# Patient Record
Sex: Female | Born: 1995
Health system: Southern US, Community
[De-identification: ages and names within clinical notes are randomized; demographics above are authoritative.]

## PROBLEM LIST (undated history)

## (undated) DIAGNOSIS — E559 Vitamin D deficiency, unspecified: Secondary | ICD-10-CM

## (undated) DIAGNOSIS — D649 Anemia, unspecified: Secondary | ICD-10-CM

## (undated) DIAGNOSIS — R002 Palpitations: Secondary | ICD-10-CM

## (undated) DIAGNOSIS — J45909 Unspecified asthma, uncomplicated: Secondary | ICD-10-CM

## (undated) HISTORY — DX: Anemia, unspecified: D64.9

## (undated) HISTORY — DX: Vitamin D deficiency, unspecified: E55.9

## (undated) HISTORY — DX: Palpitations: R00.2

## (undated) HISTORY — DX: Unspecified asthma, uncomplicated: J45.909

---
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
## 2015-02-08 ENCOUNTER — Ambulatory Visit: Payer: 59 | Admitting: Family Medicine

## 2015-02-09 ENCOUNTER — Encounter: Payer: Self-pay | Admitting: Family Medicine

## 2015-02-09 ENCOUNTER — Ambulatory Visit (INDEPENDENT_AMBULATORY_CARE_PROVIDER_SITE_OTHER): Payer: 59 | Admitting: Family Medicine

## 2015-02-09 VITALS — BP 152/99 | HR 87 | Wt 170.0 lb

## 2015-02-09 DIAGNOSIS — R002 Palpitations: Secondary | ICD-10-CM | POA: Diagnosis not present

## 2015-02-09 DIAGNOSIS — T671XXD Heat syncope, subsequent encounter: Secondary | ICD-10-CM

## 2015-02-09 MED ORDER — METOPROLOL SUCCINATE ER 25 MG PO TB24: 25.0000 mg | ORAL_TABLET | Freq: Every day | ORAL | Status: DC

## 2015-02-09 NOTE — Progress Notes (Signed)
CC: April Wilkerson is a 19 y.o. female is here for Follow-up   Subjective: HPI:  Late in October patient experienced a presyncopal episode where she was sitting on the bleachers in the sun start to feel fatigued, she went down initiated and started drinking some water. She began to get shortness of breath so she tried her inhaler however it did not help at all. She felt lightheadedness and weakness to the point where she could not get up on her own. EMS was called and did an EKG which was reportedly normal. Blood pressure was in the stage I hypertension range. She admits that she did not take any of her metoprolol the night before and has been having trouble with compliance. She denies any known side effects of this medication. Symptoms improved after she was able to get some water in her system. She's not had any interventions or follow-up since then. She tells me she feels like she is in her normal state of health and denies any irregular heartbeat or chest pain. Denies any new motor or sensory disturbances.    Review Of Systems Outlined In HPI  Past Medical History  Diagnosis Date  . Asthma, chronic 10/13/2013    No past surgical history on file. No family history on file.  Social History   Social History  . Marital Status: Single    Spouse Name: N/A  . Number of Children: N/A  . Years of Education: N/A   Occupational History  . Not on file.   Social History Main Topics  . Smoking status: Never Smoker   . Smokeless tobacco: Not on file  . Alcohol Use: No  . Drug Use: No  . Sexual Activity: Not on file   Other Topics Concern  . Not on file   Social History Narrative     Objective: BP 152/99 mmHg  Pulse 87  Wt 170 lb (77.111 kg)  General: Alert and Oriented, No Acute Distress HEENT: Pupils equal, round, reactive to light. Conjunctivae clear. Moist mucous membranes pharynx unremarkable  Lungs: Clear to auscultation bilaterally, no wheezing/ronchi/rales.   Comfortable work of breathing. Good air movement. Cardiac: Regular rate and rhythm. Normal S1/S2.  No murmurs, rubs, nor gallops.   Extremities: No peripheral edema.  Strong peripheral pulses.  Mental Status: No depression, anxiety, nor agitation. Skin: Warm and dry.  Assessment & Plan: April Wilkerson was seen today for follow-up.  Diagnoses and all orders for this visit:  Palpitations -     metoprolol succinate (TOPROL-XL) 25 MG 24 hr tablet; Take 1 tablet (25 mg total) by mouth daily. -     Echocardiogram; Future  Heat syncope, subsequent encounter -     CBC -     COMPLETE METABOLIC PANEL WITH GFR -     Echocardiogram; Future   Discussed with patient most likely explanation of her episode was due to dehydration/heat syncope. Will use this as a working diagnosis however would like to rule out structural heart abnormality along with metabolic abnormality or anemia. FOLLOW-UP WILL BE BASED ON THE ABOVE RESULTS   Return if symptoms worsen or fail to improve.

## 2015-02-10 LAB — COMPLETE METABOLIC PANEL WITH GFR
ALBUMIN: 4.2 g/dL (ref 3.6–5.1)
ALT: 14 U/L (ref 5–32)
AST: 20 U/L (ref 12–32)
Alkaline Phosphatase: 72 U/L (ref 47–176)
BUN: 10 mg/dL (ref 7–20)
CALCIUM: 9.5 mg/dL (ref 8.9–10.4)
CHLORIDE: 103 mmol/L (ref 98–110)
CO2: 25 mmol/L (ref 20–31)
CREATININE: 0.62 mg/dL (ref 0.50–1.00)
GFR, Est African American: >89 mL/min (ref >=60)
GFR, Est Non African American: >89 mL/min (ref >=60)
GLUCOSE: 73 mg/dL (ref 65–99)
POTASSIUM: 4.1 mmol/L (ref 3.8–5.1)
SODIUM: 141 mmol/L (ref 135–146)
Total Bilirubin: 0.6 mg/dL (ref 0.2–1.1)
Total Protein: 6.9 g/dL (ref 6.3–8.2)

## 2015-02-10 LAB — CBC
HCT: 36.3 % (ref 36.0–46.0)
HEMOGLOBIN: 11.7 g/dL — AB (ref 12.0–15.0)
MCH: 28 pg (ref 26.0–34.0)
MCHC: 32.2 g/dL (ref 30.0–36.0)
MCV: 86.8 fL (ref 78.0–100.0)
MPV: 10.6 fL (ref 8.6–12.4)
Platelets: 276 10*3/uL (ref 150–400)
RBC: 4.18 MIL/uL (ref 3.87–5.11)
RDW: 14.5 % (ref 11.5–15.5)
WBC: 4.3 10*3/uL (ref 4.0–10.5)

## 2015-02-16 ENCOUNTER — Ambulatory Visit (HOSPITAL_BASED_OUTPATIENT_CLINIC_OR_DEPARTMENT_OTHER): Admission: RE | Admit: 2015-02-16 | Payer: 59 | Source: Ambulatory Visit

## 2015-03-16 ENCOUNTER — Ambulatory Visit (HOSPITAL_BASED_OUTPATIENT_CLINIC_OR_DEPARTMENT_OTHER)
Admission: RE | Admit: 2015-03-16 | Discharge: 2015-03-16 | Disposition: A | Discharge status: Home / Self Care | Payer: 59 | Source: Ambulatory Visit | Attending: Family Medicine | Admitting: Family Medicine

## 2015-03-16 DIAGNOSIS — R002 Palpitations: Secondary | ICD-10-CM | POA: Diagnosis not present

## 2015-03-16 DIAGNOSIS — T671XXD Heat syncope, subsequent encounter: Secondary | ICD-10-CM

## 2015-03-16 DIAGNOSIS — X58XXXD Exposure to other specified factors, subsequent encounter: Secondary | ICD-10-CM | POA: Insufficient documentation

## 2015-03-16 DIAGNOSIS — R55 Syncope and collapse: Secondary | ICD-10-CM | POA: Diagnosis present

## 2015-03-16 NOTE — Progress Notes (Signed)
Echocardiogram 2D Echocardiogram has been performed.  Dorothey BasemanReel, Jakylah Bassinger M 03/16/2015, 1:45 PM

## 2015-06-30 ENCOUNTER — Encounter: Payer: Self-pay | Admitting: Family Medicine

## 2015-06-30 ENCOUNTER — Ambulatory Visit (INDEPENDENT_AMBULATORY_CARE_PROVIDER_SITE_OTHER): Payer: 59 | Admitting: Family Medicine

## 2015-06-30 VITALS — BP 148/93 | HR 84 | Wt 176.0 lb

## 2015-06-30 DIAGNOSIS — Z0000 Encounter for general adult medical examination without abnormal findings: Secondary | ICD-10-CM

## 2015-06-30 DIAGNOSIS — Z23 Encounter for immunization: Secondary | ICD-10-CM | POA: Diagnosis not present

## 2015-06-30 DIAGNOSIS — Z111 Encounter for screening for respiratory tuberculosis: Secondary | ICD-10-CM

## 2015-06-30 LAB — COMPLETE METABOLIC PANEL WITH GFR
ALBUMIN: 4.2 g/dL (ref 3.6–5.1)
ALK PHOS: 69 U/L (ref 47–176)
ALT: 10 U/L (ref 5–32)
AST: 16 U/L (ref 12–32)
BILIRUBIN TOTAL: 0.4 mg/dL (ref 0.2–1.1)
BUN: 13 mg/dL (ref 7–20)
CALCIUM: 9.6 mg/dL (ref 8.9–10.4)
CO2: 27 mmol/L (ref 20–31)
Chloride: 104 mmol/L (ref 98–110)
Creat: 0.69 mg/dL (ref 0.50–1.00)
GFR, Est African American: >89 mL/min (ref >=60)
GLUCOSE: 83 mg/dL (ref 65–99)
Potassium: 4.3 mmol/L (ref 3.8–5.1)
SODIUM: 141 mmol/L (ref 135–146)
TOTAL PROTEIN: 7.1 g/dL (ref 6.3–8.2)

## 2015-06-30 LAB — CBC
HEMATOCRIT: 36.4 % (ref 35.0–45.0)
HEMOGLOBIN: 11.7 g/dL (ref 11.7–15.5)
MCH: 28.1 pg (ref 27.0–33.0)
MCHC: 32.1 g/dL (ref 32.0–36.0)
MCV: 87.3 fL (ref 80.0–100.0)
MPV: 10.2 fL (ref 7.5–12.5)
Platelets: 325 10*3/uL (ref 140–400)
RBC: 4.17 MIL/uL (ref 3.80–5.10)
RDW: 14.2 % (ref 11.0–15.0)
WBC: 4.5 10*3/uL (ref 3.8–10.8)

## 2015-06-30 LAB — LIPID PANEL
CHOLESTEROL: 153 mg/dL (ref 125–170)
HDL: 67 mg/dL (ref 36–76)
LDL CALC: 77 mg/dL (ref <110)
TRIGLYCERIDES: 43 mg/dL (ref 40–136)
Total CHOL/HDL Ratio: 2.3 Ratio (ref <=5.0)
VLDL: 9 mg/dL (ref <30)

## 2015-06-30 NOTE — Progress Notes (Signed)
CC: April Wilkerson is a 20 y.o. 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  Review of Systems - General ROS: negative for - chills, fever, night sweats, weight gain or weight loss Ophthalmic ROS: negative for - decreased vision Psychological ROS: negative for - anxiety or depression ENT ROS: negative for - hearing change, nasal congestion, tinnitus or allergies Hematological and Lymphatic ROS: negative for - bleeding problems, bruising or swollen lymph nodes Breast ROS: negative Respiratory ROS: no cough, shortness of breath, or wheezing Cardiovascular ROS: no chest pain or dyspnea on exertion Gastrointestinal ROS: no abdominal pain, change in bowel habits, or black or bloody stools Genito-Urinary ROS: negative for - genital discharge, genital ulcers, incontinence or abnormal bleeding from genitals Musculoskeletal ROS: negative for - joint pain or muscle pain Neurological ROS: negative for - headaches or memory loss Dermatological ROS: negative for lumps, mole changes, rash and skin lesion changes  Past Medical History  Diagnosis Date  . Asthma, chronic 10/13/2013    No past surgical history on file. No family history on file.  Social History   Social History  . Marital Status: Single    Spouse Name: N/A  . Number of Children: N/A  . Years of Education: N/A   Occupational History  . Not on file.   Social History Main Topics  . Smoking status: Never Smoker   . Smokeless tobacco:  Not on file  . Alcohol Use: No  . Drug Use: No  . Sexual Activity: Not on file   Other Topics Concern  . Not on file   Social History Narrative     Objective: BP 148/93 mmHg  Pulse 84  Wt 176 lb (79.833 kg)  General: No Acute Distress HEENT: Atraumatic, normocephalic, conjunctivae normal without scleral icterus.  No nasal discharge, hearing grossly intact, TMs with good landmarks bilaterally with no middle ear abnormalities, posterior pharynx clear without oral lesions. Neck: Supple, trachea midline, no cervical nor supraclavicular adenopathy. Pulmonary: Clear to auscultation bilaterally without wheezing, rhonchi, nor rales. Cardiac: Regular rate and rhythm.  No murmurs, rubs, nor gallops. No peripheral edema.  2+ peripheral pulses bilaterally. Abdomen: Bowel sounds normal.  No masses.  Non-tender without rebound.  Negative Murphy's sign. MSK: Grossly intact, no signs of weakness.  Full strength throughout upper and lower extremities.  Full ROM in upper and lower extremities.  No midline spinal tenderness. Neuro: Gait unremarkable, CN II-XII grossly intact.  C5-C6 Reflex 2/4 Bilaterally, L4 Reflex 2/4 Bilaterally.  Cerebellar function intact. Skin: No rashes. Psych: Alert and oriented to person/place/time.  Thought process normal. No anxiety/depression.  Assessment & Plan: [name] was seen today for annual exam.  Diagnoses and all orders for this visit:  Annual physical exam -     Lipid panel -     CBC -     COMPLETE METABOLIC PANEL WITH GFR  Flu vaccine need -     Flu Vaccine QUAD 36+ mos PF IM (Fluarix & Fluzone Quad PF)  Encounter for PPD test   Healthy lifestyle interventions including but not  limited to regular exercise, a healthy low fat diet, moderation of salt intake, the dangers of tobacco/alcohol/recreational drug use, nutrition supplementation, and accident avoidance were discussed with the patient and a handout was provided for future reference.  We've  scheduled her to return for nurse visit on Monday to have a PPD placed and then read on Wednesday. She also had one that was normal back in July and she was asked to contact her program to see if this is valid, if so she does not need to follow-up on Monday.  No Follow-up on file.

## 2015-07-04 ENCOUNTER — Ambulatory Visit (INDEPENDENT_AMBULATORY_CARE_PROVIDER_SITE_OTHER): Payer: 59 | Admitting: Family Medicine

## 2015-07-04 VITALS — BP 140/87 | HR 81

## 2015-07-04 DIAGNOSIS — Z111 Encounter for screening for respiratory tuberculosis: Secondary | ICD-10-CM | POA: Diagnosis not present

## 2015-07-04 DIAGNOSIS — Z23 Encounter for immunization: Secondary | ICD-10-CM

## 2015-07-04 NOTE — Progress Notes (Signed)
Patient came into clinic today for PPD placement. Pt has had a PPD test in the past, never tested positive. Pt tolerated placement well in left arm, no immediate complications. Pt advised to return Wednesday for PPD read and to get her paperwork. No further questions.

## 2015-07-04 NOTE — Progress Notes (Signed)
Pt.notified

## 2015-07-06 ENCOUNTER — Ambulatory Visit (INDEPENDENT_AMBULATORY_CARE_PROVIDER_SITE_OTHER): Payer: 59 | Admitting: Family Medicine

## 2015-07-06 VITALS — BP 122/75 | HR 88

## 2015-07-06 DIAGNOSIS — Z7689 Persons encountering health services in other specified circumstances: Secondary | ICD-10-CM | POA: Diagnosis not present

## 2015-07-06 DIAGNOSIS — Z111 Encounter for screening for respiratory tuberculosis: Secondary | ICD-10-CM

## 2015-07-06 LAB — TB SKIN TEST
Induration: 0 mm
TB Skin Test: NEGATIVE

## 2015-07-06 NOTE — Progress Notes (Signed)
Patient came into clinic today for PPD read. PPD was negative. Results were documented and form was completed by PCP for school. No further questions.

## 2015-07-13 ENCOUNTER — Ambulatory Visit (INDEPENDENT_AMBULATORY_CARE_PROVIDER_SITE_OTHER): Payer: 59 | Admitting: Family Medicine

## 2015-07-13 VITALS — BP 134/85 | HR 93

## 2015-07-13 DIAGNOSIS — Z111 Encounter for screening for respiratory tuberculosis: Secondary | ICD-10-CM | POA: Diagnosis not present

## 2015-07-13 NOTE — Progress Notes (Signed)
April Wilkerson is here for a PPD placement. She has never had a positive PPD.   Patient tolerated injection well without complications.

## 2015-07-15 ENCOUNTER — Ambulatory Visit (INDEPENDENT_AMBULATORY_CARE_PROVIDER_SITE_OTHER): Payer: 59 | Admitting: Family Medicine

## 2015-07-15 VITALS — BP 126/81 | HR 95

## 2015-07-15 DIAGNOSIS — Z111 Encounter for screening for respiratory tuberculosis: Secondary | ICD-10-CM | POA: Diagnosis not present

## 2015-07-15 LAB — TB SKIN TEST
INDURATION: 0 mm
TB SKIN TEST: NEGATIVE

## 2015-07-15 NOTE — Progress Notes (Signed)
April Wilkerson is here for a PPD reading.   PPD negative with 0 mm.

## 2016-04-17 DIAGNOSIS — Z7189 Other specified counseling: Secondary | ICD-10-CM | POA: Diagnosis not present

## 2016-04-17 DIAGNOSIS — Z1159 Encounter for screening for other viral diseases: Secondary | ICD-10-CM | POA: Diagnosis not present

## 2016-06-04 DIAGNOSIS — L709 Acne, unspecified: Secondary | ICD-10-CM | POA: Diagnosis not present

## 2017-02-05 DIAGNOSIS — Z23 Encounter for immunization: Secondary | ICD-10-CM | POA: Diagnosis not present

## 2017-04-12 ENCOUNTER — Ambulatory Visit: Payer: 59 | Admitting: Osteopathic Medicine

## 2017-04-12 ENCOUNTER — Encounter: Payer: Self-pay | Admitting: Osteopathic Medicine

## 2017-04-12 VITALS — BP 143/85 | HR 96 | Temp 98.2°F | Ht 64.0 in | Wt 185.1 lb

## 2017-04-12 DIAGNOSIS — R030 Elevated blood-pressure reading, without diagnosis of hypertension: Secondary | ICD-10-CM | POA: Diagnosis not present

## 2017-04-12 DIAGNOSIS — R51 Headache: Secondary | ICD-10-CM | POA: Diagnosis not present

## 2017-04-12 DIAGNOSIS — R7989 Other specified abnormal findings of blood chemistry: Secondary | ICD-10-CM | POA: Diagnosis not present

## 2017-04-12 DIAGNOSIS — Z0000 Encounter for general adult medical examination without abnormal findings: Secondary | ICD-10-CM | POA: Diagnosis not present

## 2017-04-12 DIAGNOSIS — R6889 Other general symptoms and signs: Secondary | ICD-10-CM | POA: Diagnosis not present

## 2017-04-12 DIAGNOSIS — Z803 Family history of malignant neoplasm of breast: Secondary | ICD-10-CM | POA: Diagnosis not present

## 2017-04-12 DIAGNOSIS — J4520 Mild intermittent asthma, uncomplicated: Secondary | ICD-10-CM | POA: Diagnosis not present

## 2017-04-12 DIAGNOSIS — D649 Anemia, unspecified: Secondary | ICD-10-CM | POA: Diagnosis not present

## 2017-04-12 DIAGNOSIS — R519 Headache, unspecified: Secondary | ICD-10-CM

## 2017-04-12 MED ORDER — ALBUTEROL SULFATE HFA 108 (90 BASE) MCG/ACT IN AERS: 1.0000 | INHALATION_SPRAY | RESPIRATORY_TRACT | 99 refills | Status: DC | PRN

## 2017-04-12 NOTE — Progress Notes (Signed)
HPI: April Wilkerson is a 22 y.o. female who  has a past medical history of Asthma, chronic (10/13/2013).  she presents to [facility] today, 04/12/17,  for chief complaint of: Establish care  (+)Depression screening - stress at school, she has spoken to a counselor before about depression/anxiety and this helped. No thoughts of hurting self/others.   (+)cold hands/feet, would like [place] to look into this   (+)FH Breast CA - not sure age of mother at dx but was young (previous notes mention age 49). No abn breast lumps.   (+)Hx asthma, doesn't really bother her but ok to have inhaler on hand as needed.   See below for review of preventive care   At end of visit she mentions headaches on and off for a month or so, no stabbing pain, no focal weakness or speech/vision change, no LOC.   Not fasting today     Past medical, surgical, social and family history reviewed:  Patient Active Problem List   Diagnosis Date Noted  . Constipation 10/28/2014  . Anemia 10/15/2014  . Hypokalemia 10/15/2014  . Family history of breast cancer 10/14/2014  . Acne 02/15/2014  . Asthma, chronic 10/13/2013  . Faintness 10/13/2013   History reviewed. No pertinent surgical history.  Social History   Tobacco Use  . Smoking status: Never Smoker  Substance Use Topics  . Alcohol use: No   No family history on file.     Current medication list and allergy/intolerance information reviewed:  No meds    No Known Allergies    Review of Systems:  Constitutional:  No  fever, no chills, No recent illness, No unintentional weight changes. No significant fatigue.   HEENT: +headache, no vision change, no hearing change, No sore throat, No  sinus pressure  Cardiac: No  chest pain, No  pressure, No palpitations, No  Orthopnea  Respiratory:  No  shortness of breath. No  Cough  Gastrointestinal: No  abdominal pain, No  nausea, No  vomiting,  No  blood in stool,  No  diarrhea, No  constipation   Musculoskeletal: No new myalgia/arthralgia  Skin: No  Rash, No other wounds/concerning lesions  Genitourinary: No  incontinence, No  abnormal genital bleeding, No abnormal genital discharge  Hem/Onc: No  easy bruising/bleeding, No  abnormal lymph node  Endocrine: +cold intolerance,  No heat intolerance. No polyuria/polydipsia/polyphagia   Neurologic: No  weakness, No  dizziness, No  slurred speech/focal weakness/facial droop  Psychiatric: No  concerns with depression, +concerns with anxiety, No sleep problems, No mood problems  Exam:  BP (!) 149/88   Pulse 99   Temp 98.2 F (36.8 C) (Oral)   Ht '5\' 4"'$  (1.626 m)   Wt 185 lb 1.9 oz (84 kg)   LMP 03/27/2017   BMI 31.78 kg/m   Constitutional: VS see above. General Appearance: alert, well-developed, well-nourished, NAD  Eyes: Normal lids and conjunctive, non-icteric sclera  Ears, Nose, Mouth, Throat: MMM, Normal external inspection ears/nares/mouth/lips/gums. TM normal bilaterally. Pharynx/tonsils no erythema, no exudate. Nasal mucosa normal.   Neck: No masses, trachea midline. No thyroid enlargement. No tenderness/mass appreciated. No lymphadenopathy  Respiratory: Normal respiratory effort. no wheeze, no rhonchi, no rales  Cardiovascular: S1/S2 normal, no murmur, no rub/gallop auscultated. RRR. No lower extremity edema.   Gastrointestinal: Nontender, no masses. No hepatomegaly, no splenomegaly. No hernia appreciated. Bowel sounds normal. Rectal exam deferred.   Musculoskeletal: Gait normal. No clubbing/cyanosis of digits.   Neurological: Normal balance/coordination. No  tremor. No cranial nerve deficit on limited exam. Motor and sensation intact and symmetric. Cerebellar reflexes intact. PERRL, EOMI  Skin: warm, dry, intact. No rash/ulcer.     Psychiatric: Normal judgment/insight. Normal mood and affect. Oriented x3.     ASSESSMENT/PLAN:   Annual physical exam - Plan: CBC with  Differential/Platelet, COMPLETE METABOLIC PANEL WITH GFR, Lipid panel, TSH, VITAMIN D 25 Hydroxy (Vit-D Deficiency, Fractures)  Cold intolerance  Nonintractable episodic headache, unspecified headache type  Family history of breast cancer  Mild intermittent asthma, unspecified whether complicated  Elevated blood pressure reading - f/u nurse visit recheck 2 weeks       Patient Instructions  Keep headache diary to track the pattern of the headaches and come see me in a month or so to talk more about this, sooner if severe symptoms! Can try Excedrin Migraine medication (not sure whether these are migraines but the medication has Aspirin-Acetaminophen-Caffeine which should help)  Pap at your convenience  Labs today or can come back fasting    FEMALE PREVENTIVE CARE Updated 04/12/17   ANNUAL SCREENING/COUNSELING  Diet/Exercise - HEALTHY HABITS DISCUSSED TO DECREASE CV RISK Social History   Tobacco Use  Smoking Status Never Smoker  Smokeless Tobacco Never Used   Social History   Substance and Sexual Activity  Alcohol Use No   Depression screen PHQ 2/9 04/12/2017  Decreased Interest 1  Down, Depressed, Hopeless 1  PHQ - 2 Score 2  Altered sleeping 2  Tired, decreased energy 2  Change in appetite 1  Feeling bad or failure about yourself  2  Trouble concentrating 1  Moving slowly or fidgety/restless 0  Suicidal thoughts 0  PHQ-9 Score 10  Difficult doing work/chores Somewhat difficult    GAD 7 : Generalized Anxiety Score 04/12/2017  Nervous, Anxious, on Edge 2  Control/stop worrying 2  Worry too much - different things 2  Trouble relaxing 2  Restless 0  Easily annoyed or irritable 1  Afraid - awful might happen 0  Total GAD 7 Score 9      Domestic violence concerns - no  HTN SCREENING - SEE [place]  Sexually active in the past year - No  Need/want STI testing today? - no  Concerns about libido or pain with sex? - no  Plans for  pregnancy? - none soon  INFECTIOUS DISEASE SCREENING  HIV - does not need  GC/CT - does not need  TB - does not need  DISEASE SCREENING  Lipid - needs  DM2 - needs  Osteoporosis - women age 53+ - does not need  CANCER SCREENING  Cervical - needs  Breast - needs at age 36  Lung - does not need  Colon - does not need  ADULT VACCINATION  Influenza - annual vaccine recommended  Td - booster every 10 years   Zoster - Shingrix recommended 50+  PCV13 - was not indicated  PPSV23 - was not indicated Immunization History  Administered Date(s) Administered  . DTaP 10/02/1995, 12/10/1995, 02/04/1996, 02/23/1997  . HPV Quadrivalent 04/03/2010, 11/06/2011, 07/15/2012  . Hepatitis A 12/01/2007, 04/03/2010  . Hepatitis B 06-Apr-1995, 10/02/1995, 06/12/1996  . HiB (PRP-OMP) 10/02/1995, 12/10/1995, 02/04/1996, 02/23/1997  . IPV 10/02/1995, 12/10/1995, 09/14/1996, 01/13/2013  . Influenza,inj,Quad PF,6+ Mos 06/30/2015  . Influenza-Unspecified 03/02/2000, 01/22/2008, 01/23/2008, 04/09/2011, 01/13/2013, 01/17/2017  . MMR 09/14/1996, 01/13/2013  . Meningococcal Conjugate 12/01/2007  . Meningococcal Polysaccharide 11/06/2011  . OPV 09/14/1996  . PPD Test 10/12/2014, 07/04/2015, 07/13/2015  . Tdap 10/24/2006  .  Varicella 02/23/1997, 12/01/2007    OTHER  Fall - exercise and Vit D age 8+ - does not need   Patient Instructions  Keep headache diary to track the pattern of the headaches and come see me in a month or so to talk more about this, sooner if severe symptoms! Can try Excedrin Migraine medication (not sure whether these are migraines but the medication has Aspirin-Acetaminophen-Caffeine which should help)  Pap at your convenience  Labs today or can come back fasting     Visit summary with medication list and pertinent instructions was printed for patient to review. All questions at time ofvisit were answered - patient instructed to contact office with any  additional concerns. ER/RTC precautions were reviewed with the patient.   Follow-up plan: Return in about 1 year (around 04/12/2018) for [facility], sooner for Pap at your convenience .  Note: Total time spent on problem-based portion of visit was 25 minutes, greater than 50% of the visit was spent face-to-face counseling and coordinating care for the following: Diagnoses of Cold intolerance, Nonintractable episodic headache, unspecified headache type, and Family history of breast cancer, and Asthma and Elevated BP were pertinent to this visit.[name] Kitchen  Please note: voice recognition software was used to produce this document, and typos may escape review. Please contact Dr. Sheppard Coil for any needed clarifications.

## 2017-04-12 NOTE — Patient Instructions (Signed)
Keep headache diary to track the pattern of the headaches and come see me in a month or so to talk more about this, sooner if severe symptoms! Can try Excedrin Migraine medication (not sure whether these are migraines but the medication has Aspirin-Acetaminophen-Caffeine which should help)  Pap at your convenience  Labs today or can come back fasting

## 2017-04-13 LAB — CBC WITH DIFFERENTIAL/PLATELET
BASOS PCT: 0.6 %
Basophils Absolute: 29 cells/uL (ref 0–200)
EOS PCT: 1.9 %
Eosinophils Absolute: 91 cells/uL (ref 15–500)
HEMATOCRIT: 36.5 % (ref 35.0–45.0)
HEMOGLOBIN: 12.2 g/dL (ref 11.7–15.5)
LYMPHS ABS: 1642 {cells}/uL (ref 850–3900)
MCH: 28.3 pg (ref 27.0–33.0)
MCHC: 33.4 g/dL (ref 32.0–36.0)
MCV: 84.7 fL (ref 80.0–100.0)
MPV: 10.6 fL (ref 7.5–12.5)
Monocytes Relative: 9.2 %
NEUTROS ABS: 2597 {cells}/uL (ref 1500–7800)
Neutrophils Relative %: 54.1 %
Platelets: 279 10*3/uL (ref 140–400)
RBC: 4.31 10*6/uL (ref 3.80–5.10)
RDW: 12.9 % (ref 11.0–15.0)
Total Lymphocyte: 34.2 %
WBC: 4.8 10*3/uL (ref 3.8–10.8)
WBCMIX: 442 {cells}/uL (ref 200–950)

## 2017-04-13 LAB — LIPID PANEL
CHOL/HDL RATIO: 2.2 (calc) (ref <5.0)
Cholesterol: 169 mg/dL (ref <200)
HDL: 76 mg/dL (ref >50)
LDL Cholesterol (Calc): 78 mg/dL (calc)
NON-HDL CHOLESTEROL (CALC): 93 mg/dL (ref <130)
Triglycerides: 71 mg/dL (ref <150)

## 2017-04-13 LAB — COMPLETE METABOLIC PANEL WITH GFR
AG RATIO: 1.3 (calc) (ref 1.0–2.5)
ALT: 12 U/L (ref 6–29)
AST: 18 U/L (ref 10–30)
Albumin: 4.2 g/dL (ref 3.6–5.1)
Alkaline phosphatase (APISO): 71 U/L (ref 33–115)
BUN: 11 mg/dL (ref 7–25)
CALCIUM: 9.8 mg/dL (ref 8.6–10.2)
CO2: 27 mmol/L (ref 20–32)
CREATININE: 0.71 mg/dL (ref 0.50–1.10)
Chloride: 104 mmol/L (ref 98–110)
GFR, EST AFRICAN AMERICAN: 141 mL/min/{1.73_m2} (ref >=60)
GFR, EST NON AFRICAN AMERICAN: 122 mL/min/{1.73_m2} (ref >=60)
Globulin: 3.3 g/dL (calc) (ref 1.9–3.7)
Glucose, Bld: 69 mg/dL (ref 65–99)
POTASSIUM: 4.2 mmol/L (ref 3.5–5.3)
Sodium: 139 mmol/L (ref 135–146)
TOTAL PROTEIN: 7.5 g/dL (ref 6.1–8.1)
Total Bilirubin: 0.2 mg/dL (ref 0.2–1.2)

## 2017-04-13 LAB — VITAMIN D 25 HYDROXY (VIT D DEFICIENCY, FRACTURES): Vit D, 25-Hydroxy: 9 ng/mL — ABNORMAL LOW (ref 30–100)

## 2017-04-13 LAB — TSH: TSH: 1.54 m[IU]/L

## 2017-04-15 ENCOUNTER — Other Ambulatory Visit: Payer: Self-pay | Admitting: Osteopathic Medicine

## 2017-04-15 NOTE — Progress Notes (Signed)
Vit D def 

## 2017-04-16 MED ORDER — VITAMIN D (ERGOCALCIFEROL) 1.25 MG (50000 UNIT) PO CAPS: 50000.0000 [IU] | ORAL_CAPSULE | ORAL | 0 refills | Status: DC

## 2017-04-25 ENCOUNTER — Ambulatory Visit (INDEPENDENT_AMBULATORY_CARE_PROVIDER_SITE_OTHER): Payer: 59 | Admitting: Osteopathic Medicine

## 2017-04-25 VITALS — BP 136/76 | HR 81

## 2017-04-25 DIAGNOSIS — R030 Elevated blood-pressure reading, without diagnosis of hypertension: Secondary | ICD-10-CM | POA: Diagnosis not present

## 2017-04-25 NOTE — Progress Notes (Signed)
Agree with nurse note  F/u 1 yr next annual

## 2017-04-25 NOTE — Progress Notes (Signed)
Pt in today for a bp recheck.  She has not been on the Metoprolol for a long time is asymptomatic.  Bp is good today at 136/76.  She asked me about some things she can do to help keep her pressure down and I advised her to engage in 30-45 min of moderate exercise activity on most days of the week and to eat a diet lower in sodium and fried foods.

## 2017-05-27 DIAGNOSIS — L700 Acne vulgaris: Secondary | ICD-10-CM | POA: Diagnosis not present

## 2017-05-27 DIAGNOSIS — L219 Seborrheic dermatitis, unspecified: Secondary | ICD-10-CM | POA: Diagnosis not present

## 2017-07-05 ENCOUNTER — Other Ambulatory Visit: Payer: Self-pay | Admitting: Osteopathic Medicine

## 2017-11-06 ENCOUNTER — Encounter: Payer: Self-pay | Admitting: Osteopathic Medicine

## 2017-11-06 ENCOUNTER — Ambulatory Visit (INDEPENDENT_AMBULATORY_CARE_PROVIDER_SITE_OTHER): Payer: 59 | Admitting: Osteopathic Medicine

## 2017-11-06 ENCOUNTER — Other Ambulatory Visit (HOSPITAL_COMMUNITY)
Admission: RE | Admit: 2017-11-06 | Discharge: 2017-11-06 | Disposition: A | Discharge status: Home / Self Care | Payer: 59 | Source: Ambulatory Visit | Attending: Family Medicine | Admitting: Family Medicine

## 2017-11-06 VITALS — BP 138/90 | HR 90 | Temp 98.1°F | Ht 64.0 in | Wt 196.0 lb

## 2017-11-06 DIAGNOSIS — Z803 Family history of malignant neoplasm of breast: Secondary | ICD-10-CM | POA: Diagnosis not present

## 2017-11-06 DIAGNOSIS — Z01419 Encounter for gynecological examination (general) (routine) without abnormal findings: Secondary | ICD-10-CM | POA: Diagnosis not present

## 2017-11-06 DIAGNOSIS — Z124 Encounter for screening for malignant neoplasm of cervix: Secondary | ICD-10-CM | POA: Diagnosis not present

## 2017-11-06 NOTE — Patient Instructions (Signed)
For breast cancer history, we can do genetic testing to evaluate your risk of breast cancer. We may also consider testing with mammogram +/- MRI starting at age 22. Let me know if you'd like to go ahead with the genetic testing.   Pap results should be back within a week!

## 2017-11-06 NOTE — Progress Notes (Signed)
HPI: April Wilkerson is a 22 y.o. female who  has a past medical history of Asthma, chronic (10/13/2013) and Palpitations.  she presents to [facility] today, 11/06/17,  for chief complaint of:  Pap smear  Annual exam 03/2017, never had Pap done. Here today for Pap.  (+)FH breast cancer, mother deceased at an early age, pt unsure exact age.    Past medical history, surgical history, and family history reviewed.  Current medication list and allergy/intolerance information reviewed.   (See remainder of HPI, ROS, Phys Exam below)    ASSESSMENT/PLAN:   Encounter for Papanicolaou smear for cervical cancer screening - Plan: Cytology - PAP  Family history of breast cancer    Patient Instructions  For breast cancer history, we can do genetic testing to evaluate your risk of breast cancer. We may also consider testing with mammogram +/- MRI starting at age 22. Let me know if you'd like to go ahead with the genetic testing.   Pap results should be back within a week!    Follow-up plan: Return for annual physical when due in 03/2018 .                   ############################################ ############################################ ############################################ ############################################    Outpatient Encounter Medications as of 11/06/2017  Medication Sig Note  . albuterol (PROVENTIL HFA;VENTOLIN HFA) 108 (90 Base) MCG/ACT inhaler Inhale 1-2 puffs into the lungs every 4 (four) hours as needed for wheezing or shortness of breath.   . minocycline (MINOCIN,DYNACIN) 100 MG capsule Take 100 mg by mouth 2 (two) times daily. 04/12/2017: For acne  . [DISCONTINUED] ketoconazole (NIZORAL) 2 % shampoo APP AA QD UTD   . [DISCONTINUED] metoprolol succinate (TOPROL-XL) 25 MG 24 hr tablet Take 1 tablet (25 mg total) by mouth daily. (Patient not taking: Reported on 04/25/2017)   . [DISCONTINUED] Vitamin  D, Ergocalciferol, (DRISDOL) 50000 units CAPS capsule Take 1 capsule (50,000 Units total) by mouth every 7 (seven) days. Take for 12 total doses(weeks) (Patient not taking: Reported on 11/06/2017)    No facility-administered encounter medications on file as of 11/06/2017.    No Known Allergies    Review of Systems:  Constitutional: No recent illness  Cardiac: No  chest pain  Respiratory:  No  shortness of breath.  Gastrointestinal: No  abdominal pain  Musculoskeletal: No new myalgia/arthralgia  Skin: No  Rash  Neurologic: No  weakness, No  Dizziness  Psychiatric: No  concerns with depression, No  concerns with anxiety  Exam:  BP 138/90   Pulse 90   Temp 98.1 F (36.7 C) (Oral)   Ht 5\' 4"  (1.626 m)   Wt 196 lb (88.9 kg)   BMI 33.64 kg/m   Constitutional: VS see above. General Appearance: alert, well-developed, well-nourished, NAD  Eyes: Normal lids and conjunctive, non-icteric sclera  Ears, Nose, Mouth, Throat: MMM, Normal external inspection ears/nares/mouth/lips/gums.  Neck: No masses, trachea midline.   Respiratory: Normal respiratory effort.   Skin: warm, dry, intact.   Psychiatric: Normal judgment/insight. Normal mood and affect. Oriented x3.  GYN: No lesions/ulcers to external genitalia, normal urethra, normal vaginal mucosa, physiologic discharge, cervix normal without lesions, uterus not enlarged or tender, adnexa no masses and nontender  BREAST: No rashes/skin changes, normal fibrous breast tissue, no masses or tenderness, normal nipple without discharge, normal axilla   Visit summary with medication list and pertinent instructions was printed for patient to review, advised to alert us if any changes needed. All  questions at time of visit were answered - patient instructed to contact office with any additional concerns. ER/RTC precautions were reviewed with the patient and understanding verbalized.   Follow-up plan: Return for annual physical when due in  03/2018 .    Please note: voice recognition software was used to produce this document, and typos may escape review. Please contact Dr. Lyn HollingsheadAlexander for any needed clarifications.

## 2017-11-07 LAB — CYTOLOGY - PAP: DIAGNOSIS: NEGATIVE

## 2017-11-07 NOTE — Progress Notes (Signed)
Since she is not a medicare patient G0101 is not appropriate. I will recommend in this case bill a level 2 est visit with one of the dx below:  Z01.411 Encounter for gynecological examination (general) (routine) with abnormal findings Z01.419 Encounter for gynecological examination (general) (routine) without abnormal findings  The component of E/M documentation should be documented to support this charge.    Thank you

## 2018-03-24 DIAGNOSIS — L219 Seborrheic dermatitis, unspecified: Secondary | ICD-10-CM | POA: Diagnosis not present

## 2018-03-24 DIAGNOSIS — L709 Acne, unspecified: Secondary | ICD-10-CM | POA: Diagnosis not present

## 2018-03-26 DIAGNOSIS — L700 Acne vulgaris: Secondary | ICD-10-CM | POA: Diagnosis not present

## 2018-05-01 ENCOUNTER — Encounter: Payer: Self-pay | Admitting: Osteopathic Medicine

## 2018-05-01 ENCOUNTER — Ambulatory Visit (INDEPENDENT_AMBULATORY_CARE_PROVIDER_SITE_OTHER): Payer: 59 | Admitting: Osteopathic Medicine

## 2018-05-01 VITALS — BP 127/79 | HR 79 | Temp 98.6°F | Wt 200.3 lb

## 2018-05-01 DIAGNOSIS — G4489 Other headache syndrome: Secondary | ICD-10-CM | POA: Diagnosis not present

## 2018-05-01 DIAGNOSIS — Z0101 Encounter for examination of eyes and vision with abnormal findings: Secondary | ICD-10-CM | POA: Diagnosis not present

## 2018-05-01 MED ORDER — ASPIRIN-ACETAMINOPHEN-CAFFEINE 250-250-65 MG PO TABS: 1.0000 | ORAL_TABLET | Freq: Four times a day (QID) | ORAL | 0 refills | Status: AC | PRN

## 2018-05-01 NOTE — Progress Notes (Signed)
HPI: April Wilkerson is a 23 y.o. female who  has a past medical history of Asthma, chronic (10/13/2013) and Palpitations.  [facility] today, 05/01/18,  for chief complaint of: Headache   . Context: no previous headache history, she is worried about migraines . Location: mostly on R, sometimes bilateral . Quality: pressure, "like sinus but not really like sinus" and no sharp pain. No sensitivity to sound, no nausea. Some light sensitivity.  . Duration: about a week  . Timing: daily, evenings, 30  mins to whole evening.  . Modifying factors: took ibuprofen last night which was helpful  . Assoc signs/symptoms: BP elevated on intake, better on recheck        Past medical, surgical, social and family history reviewed:  Patient Active Problem List   Diagnosis Date Noted  . Constipation 10/28/2014  . Anemia 10/15/2014  . Hypokalemia 10/15/2014  . Family history of breast cancer 10/14/2014  . Acne 02/15/2014  . Asthma, chronic 10/13/2013  . Faintness 10/13/2013    No past surgical history on file.  Social History   Tobacco Use  . Smoking status: Never Smoker  . Smokeless tobacco: Never Used  Substance Use Topics  . Alcohol use: No    Family History  Problem Relation Age of Onset  . Cancer Mother        breast cancer, not sure what age diagnosed   . Hypertension Father      Current medication list and allergy/intolerance information reviewed:    Current Outpatient Medications  Medication Sig Dispense Refill  . albuterol (PROVENTIL HFA;VENTOLIN HFA) 108 (90 Base) MCG/ACT inhaler Inhale 1-2 puffs into the lungs every 4 (four) hours as needed for wheezing or shortness of breath. 2 Inhaler prn  . spironolactone (ALDACTONE) 25 MG tablet Take 25 mg by mouth daily.    [name] Kitchen. aspirin-acetaminophen-caffeine (EXCEDRIN MIGRAINE) 250-250-65 MG tablet Take 1-2 tablets by mouth every 6 (six) hours as needed for headache. 30  tablet 0  . minocycline (MINOCIN,DYNACIN) 100 MG capsule Take 100 mg by mouth 2 (two) times daily.  2   No current facility-administered medications for this visit.     No Known Allergies    Review of Systems:  Constitutional:  No  fever, no chills, No recent illness, No unintentional weight changes. No significant fatigue.   HEENT: +headache as per HPI, no vision change, no hearing change, No sore throat, No  sinus pressure  Cardiac: No  chest pain, No  pressure  Respiratory:  No  shortness of breath. No  Cough  Gastrointestinal: No  abdominal pain, No  nausea,  Musculoskeletal: No new myalgia/arthralgia  Skin: No  Rash  Neurologic: No  weakness, No  dizziness, No  slurred speech/focal weakness/facial droop  Psychiatric: No  concerns with depression, No  concerns with anxiety, No sleep problems, No mood problems  Exam:  BP 127/79 (BP Location: Left Arm, Patient Position: Sitting, Cuff Size: Normal)   Pulse 79   Temp 98.6 F (37 C) (Oral)   Wt 200 lb 4.8 oz (90.9 kg)   BMI 34.38 kg/m   Constitutional: VS see above. General Appearance: alert, well-developed, well-nourished, NAD  Eyes: Normal lids and conjunctive, non-icteric sclera, EOMI, PERRLA, no nystagmus.  Limited nondilated funduscopic exam no papilledema appreciated  Ears, Nose, Mouth, Throat: MMM, Normal external inspection ears/nares/mouth/lips/gums. TM normal bilaterally. Pharynx/tonsils no erythema, no exudate. Nasal mucosa normal.   Neck: No masses, trachea midline. No thyroid enlargement. No  tenderness/mass appreciated. No lymphadenopathy  Respiratory: Normal respiratory effort. no wheeze, no rhonchi, no rales  Cardiovascular: S1/S2 normal, no murmur, no rub/gallop auscultated. RRR. No lower extremity edema.   Gastrointestinal: Nontender, no masses. No hepatomegaly, no splenomegaly. No hernia appreciated. Bowel sounds normal. Rectal exam deferred.   Musculoskeletal: Gait normal. No clubbing/cyanosis of  digits.   Neurological: Normal balance/coordination. No tremor. No cranial nerve deficit on limited exam. Motor and sensation intact and symmetric. Cerebellar reflexes intact.   Skin: warm, dry, intact. No rash/ulcer.  Psychiatric: Normal judgment/insight. Normal mood and affect. Oriented x3.       OD: 20/100 OS: 20/20 OU: 20/20     ASSESSMENT/PLAN: The primary encounter diagnosis was Other headache syndrome. A diagnosis of Vision exam with abnormal findings was also pertinent to this visit.  Patient will check with her insurance about coverage for vision/optometry, will get back to [place] in case she needs a referral.  Orders Placed This Encounter  Procedures  . CBC  . COMPLETE METABOLIC PANEL WITH GFR    Meds ordered this encounter  Medications  . aspirin-acetaminophen-caffeine (EXCEDRIN MIGRAINE) 250-250-65 MG tablet    Sig: Take 1-2 tablets by mouth every 6 (six) hours as needed for headache.    Dispense:  30 tablet    Refill:  0    Patient Instructions  Headache could possibly be mild migraine, but you are not really meeting the full criteria for a migraine diagnosis.  We will keep this in mind as we move forward and consider migraine treatment if some of the other things are not working and if you continue to have headaches.  I am more suspicious of tension headache.  I would like to get some blood work given the recent addition of Spironolactone, making sure electrolytes are normal as sometimes sodium/potassium and other electrolyte abnormalities can cause issues with headaches.  I sent in a medication to take as needed for headache. Please keep a headache diary over the next couple weeks (see attached).          Visit summary with medication list and pertinent instructions was printed for patient to review. All questions at time of visit were answered - patient instructed to contact office with any additional concerns or updates. ER/RTC precautions were reviewed  with the patient.    Please note: voice recognition software was used to produce this document, and typos may escape review. Please contact Dr. Lyn Hollingshead for any needed clarifications.     Follow-up plan: Return in about 4 weeks (around 05/29/2018) for recheck headache if needed - call/message me sooner if worse or change! [name] Kitchen

## 2018-05-01 NOTE — Patient Instructions (Addendum)
Headache could possibly be mild migraine, but you are not really meeting the full criteria for a migraine diagnosis.  We will keep this in mind as we move forward and consider migraine treatment if some of the other things are not working and if you continue to have headaches.  I am more suspicious of tension headache.  I would like to get some blood work given the recent addition of Spironolactone, making sure electrolytes are normal as sometimes sodium/potassium and other electrolyte abnormalities can cause issues with headaches.  I sent in a medication to take as needed for headache. Please keep a headache diary over the next couple weeks (see attached).

## 2018-05-02 LAB — CBC
HCT: 36.4 % (ref 35.0–45.0)
Hemoglobin: 12.4 g/dL (ref 11.7–15.5)
MCH: 28.8 pg (ref 27.0–33.0)
MCHC: 34.1 g/dL (ref 32.0–36.0)
MCV: 84.5 fL (ref 80.0–100.0)
MPV: 10.9 fL (ref 7.5–12.5)
PLATELETS: 308 10*3/uL (ref 140–400)
RBC: 4.31 10*6/uL (ref 3.80–5.10)
RDW: 12.9 % (ref 11.0–15.0)
WBC: 5.2 10*3/uL (ref 3.8–10.8)

## 2018-05-02 LAB — COMPLETE METABOLIC PANEL WITH GFR
AG Ratio: 1.4 (calc) (ref 1.0–2.5)
ALKALINE PHOSPHATASE (APISO): 72 U/L (ref 31–125)
ALT: 8 U/L (ref 6–29)
AST: 14 U/L (ref 10–30)
Albumin: 4.2 g/dL (ref 3.6–5.1)
BUN: 14 mg/dL (ref 7–25)
CO2: 28 mmol/L (ref 20–32)
CREATININE: 0.67 mg/dL (ref 0.50–1.10)
Calcium: 9.5 mg/dL (ref 8.6–10.2)
Chloride: 104 mmol/L (ref 98–110)
GFR, Est African American: 145 mL/min/{1.73_m2} (ref >=60)
GFR, Est Non African American: 125 mL/min/{1.73_m2} (ref >=60)
GLOBULIN: 3.1 g/dL (ref 1.9–3.7)
Glucose, Bld: 76 mg/dL (ref 65–99)
POTASSIUM: 4.3 mmol/L (ref 3.5–5.3)
SODIUM: 139 mmol/L (ref 135–146)
Total Bilirubin: 0.5 mg/dL (ref 0.2–1.2)
Total Protein: 7.3 g/dL (ref 6.1–8.1)

## 2018-05-13 ENCOUNTER — Encounter: Payer: Self-pay | Admitting: Osteopathic Medicine

## 2018-05-13 ENCOUNTER — Ambulatory Visit (INDEPENDENT_AMBULATORY_CARE_PROVIDER_SITE_OTHER): Payer: 59 | Admitting: Osteopathic Medicine

## 2018-05-13 VITALS — BP 138/73 | HR 78 | Temp 98.3°F | Wt 198.7 lb

## 2018-05-13 DIAGNOSIS — Z23 Encounter for immunization: Secondary | ICD-10-CM | POA: Diagnosis not present

## 2018-05-13 DIAGNOSIS — Z0000 Encounter for general adult medical examination without abnormal findings: Secondary | ICD-10-CM | POA: Diagnosis not present

## 2018-05-13 NOTE — Progress Notes (Signed)
HPI: April Wilkerson is a 23 y.o. female who  has a past medical history of Asthma, chronic (10/13/2013) and Palpitations.  she presents to [facility] today, 05/13/18,  for chief complaint of: Annual physical Recheck headache     Patient here for annual physical / wellness exam.  See preventive care reviewed as below.  Recent labs reviewed in detail with the patient. Lipids normal last year.   Additional concerns today include:  None Headaches have resolved - keeping diary      Past medical, surgical, social and family history reviewed:  Patient Active Problem List   Diagnosis Date Noted  . Constipation 10/28/2014  . Anemia 10/15/2014  . Hypokalemia 10/15/2014  . Family history of breast cancer 10/14/2014  . Acne 02/15/2014  . Asthma, chronic 10/13/2013  . Faintness 10/13/2013    No past surgical history on file.  Social History   Tobacco Use  . Smoking status: Never Smoker  . Smokeless tobacco: Never Used  Substance Use Topics  . Alcohol use: No    Family History  Problem Relation Age of Onset  . Cancer Mother        breast cancer, not sure what age diagnosed   . Hypertension Father      Current medication list and allergy/intolerance information reviewed:    Current Outpatient Medications  Medication Sig Dispense Refill  . albuterol (PROVENTIL HFA;VENTOLIN HFA) 108 (90 Base) MCG/ACT inhaler Inhale 1-2 puffs into the lungs every 4 (four) hours as needed for wheezing or shortness of breath. 2 Inhaler prn  . aspirin-acetaminophen-caffeine (EXCEDRIN MIGRAINE) 250-250-65 MG tablet Take 1-2 tablets by mouth every 6 (six) hours as needed for headache. 30 tablet 0  . minocycline (MINOCIN,DYNACIN) 100 MG capsule Take 100 mg by mouth 2 (two) times daily.  2  . spironolactone (ALDACTONE) 25 MG tablet Take 25 mg by mouth daily.     No current facility-administered medications for this visit.     No Known Allergies     Review of Systems:  Constitutional:  No  fever, no chills, No recent illness, No unintentional weight changes. No significant fatigue.   HEENT: No  headache, no vision change, no hearing change, No sore throat, No  sinus pressure  Cardiac: No  chest pain, No  pressure, No palpitations, No  Orthopnea  Respiratory:  No  shortness of breath. No  Cough  Gastrointestinal: No  abdominal pain, No  nausea, No  vomiting,  No  blood in stool, No  diarrhea, No  constipation   Musculoskeletal: No new myalgia/arthralgia  Skin: No  Rash, No other wounds/concerning lesions  Genitourinary: No  incontinence, No  abnormal genital bleeding, No abnormal genital discharge  Hem/Onc: No  easy bruising/bleeding, No  abnormal lymph node  Endocrine: No cold intolerance,  No heat intolerance. No polyuria/polydipsia/polyphagia   Neurologic: No  weakness, No  dizziness, No  slurred speech/focal weakness/facial droop  Psychiatric: No  concerns with depression, No  concerns with anxiety, No sleep problems, No mood problems  Exam:  BP 138/73 (BP Location: Left Arm, Patient Position: Sitting, Cuff Size: Normal)   Pulse 78   Temp 98.3 F (36.8 C) (Oral)   Wt 198 lb 11.2 oz (90.1 kg)   BMI 34.11 kg/m   Constitutional: VS see above. General Appearance: alert, well-developed, well-nourished, NAD  Eyes: Normal lids and conjunctive, non-icteric sclera  Ears, Nose, Mouth, Throat: MMM, Normal external inspection ears/nares/mouth/lips/gums. TM normal bilaterally. Pharynx/tonsils no erythema, no exudate.  Nasal mucosa normal.   Neck: No masses, trachea midline. No thyroid enlargement. No tenderness/mass appreciated. No lymphadenopathy  Respiratory: Normal respiratory effort. no wheeze, no rhonchi, no rales  Cardiovascular: S1/S2 normal, no murmur, no rub/gallop auscultated. RRR. No lower extremity edema.   Gastrointestinal: Nontender, no masses. No hepatomegaly, no splenomegaly. No hernia appreciated. Bowel  sounds normal. Rectal exam deferred.   Musculoskeletal: Gait normal. No clubbing/cyanosis of digits.   Neurological: Normal balance/coordination. No tremor. No cranial nerve deficit on limited exam. Motor and sensation intact and symmetric. Cerebellar reflexes intact.   Skin: warm, dry, intact. No rash/ulcer. No concerning nevi or subq nodules on limited exam.    Psychiatric: Normal judgment/insight. Normal mood and affect. Oriented x3.    Immunization History  Administered Date(s) Administered  . DTaP 10/02/1995, 12/10/1995, 02/04/1996, 02/23/1997  . HPV Quadrivalent 04/03/2010, 11/06/2011, 07/15/2012  . Hepatitis A 12/01/2007, 04/03/2010  . Hepatitis B 27-Nov-1995, 10/02/1995, 06/12/1996  . HiB (PRP-OMP) 10/02/1995, 12/10/1995, 02/04/1996, 02/23/1997  . IPV 10/02/1995, 12/10/1995, 09/14/1996, 01/13/2013  . Influenza,inj,Quad PF,6+ Mos 06/30/2015, 05/13/2018  . Influenza-Unspecified 03/02/2000, 01/22/2008, 01/23/2008, 04/09/2011, 01/13/2013, 01/17/2017  . MMR 09/14/1996, 01/13/2013  . Meningococcal Conjugate 12/01/2007  . Meningococcal Polysaccharide 11/06/2011  . OPV 09/14/1996  . PPD Test 10/12/2014, 07/04/2015, 07/13/2015  . Tdap 10/24/2006, 05/13/2018  . Varicella 02/23/1997, 12/01/2007       ASSESSMENT/PLAN: The primary encounter diagnosis was Need for influenza vaccination. A diagnosis of Need for Tdap vaccination was also pertinent to this visit.   Orders Placed This Encounter  Procedures  . Flu Vaccine QUAD 6+ mos PF IM (Fluarix Quad PF)  . Tdap vaccine greater than or equal to 7yo IM      Patient Instructions  General Preventive Care  Most recent routine screening labs: looked ok! We check cholesterol every other year in young, healthy people.   Everyone should have blood pressure checked once per year.   Tobacco: don't!  Alcohol: responsible moderation is ok for most adults - if you have concerns about your alcohol intake, please talk to me!    Exercise: as tolerated to reduce risk of cardiovascular disease and diabetes. Strength training will also prevent osteoporosis.   Mental health: if need for mental health care (medicines, counseling, other), or concerns about moods, please let me know!   Sexual health: if need for STD testing, or if concerns with libido/pain problems, please let me know! If you need to discuss your birth control options, please let me know!   Advanced Directive: Living Will and/or Healthcare Power of Attorney recommended for all adults, regardless of age or health.  Vaccines  Flu vaccine: recommended for almost everyone, every fall.   Tetanus booster: Tdap recommended every 10 years. Last done 2008,    HPV vaccine: done Cancer screenings   Colon cancer screening: recommended for everyone at age 62, but some folks need a colonoscopy sooner if risk factors   Breast cancer screening: given your family history, would strongly consider genetic testing. You've declined referral today, but if you change your mind, pease let me know if you'd like to pursue this!   Cervical cancer screening: Pap due 10/2020.    Lung cancer screening: not needed for non-smokers  Infection screenings . HIV: recommended screening at least once age 82-65, more often as needed. . Gonorrhea/Chlamydia: screening as needed, though many insurances require testing for anyone on birth control pills. . Hepatitis C: recommended for anyone born 50-1965 . TB: certain at-risk populations, or depending on work requirements  and/or travel history Other . Bone Density Test: recommended for women at age 23        Visit summary with medication list and pertinent instructions was printed for patient to review. All questions at time of visit were answered - patient instructed to contact office with any additional concerns or updates. ER/RTC precautions were reviewed with the patient.      Please note: voice recognition software was  used to produce this document, and typos may escape review. Please contact Dr. Sheppard Coil for any needed clarifications.     Follow-up plan: Return in about 1 year (around 05/14/2019) for annual physical, sooner if needed! [name] Kitchen

## 2018-05-13 NOTE — Patient Instructions (Signed)
General Preventive Care  Most recent routine screening labs: looked ok! We check cholesterol every other year in young, healthy people.   Everyone should have blood pressure checked once per year.   Tobacco: don't!  Alcohol: responsible moderation is ok for most adults - if you have concerns about your alcohol intake, please talk to me!   Exercise: as tolerated to reduce risk of cardiovascular disease and diabetes. Strength training will also prevent osteoporosis.   Mental health: if need for mental health care (medicines, counseling, other), or concerns about moods, please let me know!   Sexual health: if need for STD testing, or if concerns with libido/pain problems, please let me know! If you need to discuss your birth control options, please let me know!   Advanced Directive: Living Will and/or Healthcare Power of Attorney recommended for all adults, regardless of age or health.  Vaccines  Flu vaccine: recommended for almost everyone, every fall.   Tetanus booster: Tdap recommended every 10 years. Last done 2008,    HPV vaccine: done Cancer screenings   Colon cancer screening: recommended for everyone at age 47, but some folks need a colonoscopy sooner if risk factors   Breast cancer screening: given your family history, would strongly consider genetic testing. You've declined referral today, but if you change your mind, pease let me know if you'd like to pursue this!   Cervical cancer screening: Pap due 10/2020.    Lung cancer screening: not needed for non-smokers  Infection screenings . HIV: recommended screening at least once age 62-65, more often as needed. . Gonorrhea/Chlamydia: screening as needed, though many insurances require testing for anyone on birth control pills. . Hepatitis C: recommended for anyone born 46-1965 . TB: certain at-risk populations, or depending on work requirements and/or travel history Other . Bone Density Test: recommended for women at age  30

## 2018-05-28 ENCOUNTER — Ambulatory Visit: Payer: 59 | Admitting: Family Medicine

## 2018-12-15 ENCOUNTER — Other Ambulatory Visit: Payer: Self-pay

## 2018-12-15 MED ORDER — ALBUTEROL SULFATE HFA 108 (90 BASE) MCG/ACT IN AERS: 1.0000 | INHALATION_SPRAY | RESPIRATORY_TRACT | 1 refills | Status: DC | PRN

## 2019-03-04 ENCOUNTER — Encounter: Payer: Self-pay | Admitting: Osteopathic Medicine

## 2019-03-04 ENCOUNTER — Ambulatory Visit (INDEPENDENT_AMBULATORY_CARE_PROVIDER_SITE_OTHER): Payer: 59 | Admitting: Osteopathic Medicine

## 2019-03-04 DIAGNOSIS — R3989 Other symptoms and signs involving the genitourinary system: Secondary | ICD-10-CM | POA: Diagnosis not present

## 2019-03-04 MED ORDER — CEPHALEXIN 500 MG PO CAPS: 500.0000 mg | ORAL_CAPSULE | Freq: Two times a day (BID) | ORAL | 0 refills | Status: DC

## 2019-03-04 NOTE — Progress Notes (Signed)
Virtual Visit via Video (App used: Doximity) Note  I connected with      April Wilkerson on 03/04/19 at 10:14 AM  by a telemedicine application and verified that I am speaking with the correct person using two identifiers.  Patient is at home I am in office   I discussed the limitations of evaluation and management by telemedicine and the availability of in person appointments. The patient expressed understanding and agreed to proceed.  History of Present Illness: April Wilkerson is a 23 y.o. female who would like to discuss possible UTI    Urgency started last week but not peeing much volume. Over the weekend noticed some cloudy urine. Tried cranberry juice, water. No cloudy urine since then.  Still having some urgency frequency symptoms     Observations/Objective: LMP 02/04/2019  BP Readings from Last 3 Encounters:  05/13/18 138/73  05/01/18 127/79  11/06/17 138/90   Exam: Normal Speech.  NAD  Lab and Radiology Results No results found for this or any previous visit (from the past 72 hour(s)). No results found.     Assessment and Plan: 23 y.o. female with The encounter diagnosis was Urinary problem.  Suspicious for UTI, though patient is not exhibiting classic dysuria symptoms.  I advised her to come on into the office when she is able to do so to provide a urine specimen, prior to starting antibiotics which I have sent to the pharmacy.  Hopefully will have UA/micro back by tomorrow and will have a good idea of likelihood of UTI based on WBC count, otherwise culture will be pending  PDMP not reviewed this encounter. Orders Placed This Encounter  Procedures  . URINE CULTURE  . Urinalysis, Routine w reflex microscopic   Meds ordered this encounter  Medications  . cephALEXin (KEFLEX) 500 MG capsule    Sig: Take 1 capsule (500 mg total) by mouth 2 (two) times daily.    Dispense:  14 capsule    Refill:  0        Follow Up Instructions:  Return for RECHECK PENDING URINE TESTING RESULTS / IF WORSE OR CHANGE.    I discussed the assessment and treatment plan with the patient. The patient was provided an opportunity to ask questions and all were answered. The patient agreed with the plan and demonstrated an understanding of the instructions.   The patient was advised to call back or seek an in-person evaluation if any new concerns, if symptoms worsen or if the condition fails to improve as anticipated.  15 minutes of non-face-to-face time was provided during this encounter.      . . . . . . . . . . . . . [name] Kitchen                   Historical information moved to improve visibility of documentation.  Past Medical History:  Diagnosis Date  . Asthma, chronic 10/13/2013  . Palpitations    History reviewed. No pertinent surgical history. Social History   Tobacco Use  . Smoking status: Never Smoker  . Smokeless tobacco: Never Used  Substance Use Topics  . Alcohol use: No   family history includes Cancer in her mother; Hypertension in her father.  Medications: Current Outpatient Medications  Medication Sig Dispense Refill  . albuterol (VENTOLIN HFA) 108 (90 Base) MCG/ACT inhaler Inhale 1-2 puffs into the lungs every 4 (four) hours as needed for wheezing or shortness of breath. 18 g 1  . aspirin-acetaminophen-caffeine (  EXCEDRIN MIGRAINE) 250-250-65 MG tablet Take 1-2 tablets by mouth every 6 (six) hours as needed for headache. 30 tablet 0  . MULTIPLE VITAMIN PO Take 1 tablet by mouth daily.    . cephALEXin (KEFLEX) 500 MG capsule Take 1 capsule (500 mg total) by mouth 2 (two) times daily. 14 capsule 0  . minocycline (MINOCIN,DYNACIN) 100 MG capsule Take 100 mg by mouth 2 (two) times daily.  2  . spironolactone (ALDACTONE) 25 MG tablet Take 25 mg by mouth daily.     No current facility-administered medications for this visit.   No Known Allergies

## 2019-03-05 LAB — URINALYSIS, ROUTINE W REFLEX MICROSCOPIC
Bilirubin Urine: NEGATIVE
Glucose, UA: NEGATIVE
Hgb urine dipstick: NEGATIVE
Ketones, ur: NEGATIVE
Leukocytes,Ua: NEGATIVE
Nitrite: NEGATIVE
Protein, ur: NEGATIVE
Specific Gravity, Urine: 1.016 (ref 1.001–1.03)
pH: 8 (ref 5.0–8.0)

## 2019-03-05 LAB — URINE CULTURE
MICRO NUMBER:: 1205240
SPECIMEN QUALITY:: ADEQUATE

## 2019-05-20 ENCOUNTER — Encounter: Payer: 59 | Admitting: Osteopathic Medicine

## 2019-06-05 ENCOUNTER — Telehealth: Payer: Self-pay

## 2019-06-05 NOTE — Telephone Encounter (Signed)
Prior Authorization for One[employer] 1.2-3.75% Gel done through CoverMyMeds.  Per CoverMyMeds the PA has been resolved, no additional PA is required. For further inquiries please contact the number on the back of the member prescription card.

## 2019-06-09 ENCOUNTER — Ambulatory Visit (INDEPENDENT_AMBULATORY_CARE_PROVIDER_SITE_OTHER): Payer: 59 | Admitting: Osteopathic Medicine

## 2019-06-09 ENCOUNTER — Other Ambulatory Visit: Payer: Self-pay

## 2019-06-09 ENCOUNTER — Encounter: Payer: Self-pay | Admitting: Osteopathic Medicine

## 2019-06-09 VITALS — BP 131/74 | HR 65 | Temp 98.2°F | Wt 196.0 lb

## 2019-06-09 DIAGNOSIS — D649 Anemia, unspecified: Secondary | ICD-10-CM | POA: Diagnosis not present

## 2019-06-09 DIAGNOSIS — Z803 Family history of malignant neoplasm of breast: Secondary | ICD-10-CM | POA: Diagnosis not present

## 2019-06-09 DIAGNOSIS — J4520 Mild intermittent asthma, uncomplicated: Secondary | ICD-10-CM | POA: Diagnosis not present

## 2019-06-09 DIAGNOSIS — Z0000 Encounter for general adult medical examination without abnormal findings: Secondary | ICD-10-CM | POA: Diagnosis not present

## 2019-06-09 LAB — COMPLETE METABOLIC PANEL WITH GFR
AG Ratio: 1.4 (calc) (ref 1.0–2.5)
ALT: 9 U/L (ref 6–29)
AST: 14 U/L (ref 10–30)
Albumin: 4.2 g/dL (ref 3.6–5.1)
Alkaline phosphatase (APISO): 67 U/L (ref 31–125)
BUN: 12 mg/dL (ref 7–25)
CO2: 26 mmol/L (ref 20–32)
Calcium: 9.6 mg/dL (ref 8.6–10.2)
Chloride: 106 mmol/L (ref 98–110)
Creat: 0.76 mg/dL (ref 0.50–1.10)
GFR, Est African American: 128 mL/min/{1.73_m2} (ref >=60)
GFR, Est Non African American: 111 mL/min/{1.73_m2} (ref >=60)
Globulin: 3 g/dL (calc) (ref 1.9–3.7)
Glucose, Bld: 87 mg/dL (ref 65–99)
Potassium: 4.4 mmol/L (ref 3.5–5.3)
Sodium: 139 mmol/L (ref 135–146)
Total Bilirubin: 0.4 mg/dL (ref 0.2–1.2)
Total Protein: 7.2 g/dL (ref 6.1–8.1)

## 2019-06-09 LAB — CBC
HCT: 35 % (ref 35.0–45.0)
Hemoglobin: 11.6 g/dL — ABNORMAL LOW (ref 11.7–15.5)
MCH: 28.4 pg (ref 27.0–33.0)
MCHC: 33.1 g/dL (ref 32.0–36.0)
MCV: 85.8 fL (ref 80.0–100.0)
MPV: 10.9 fL (ref 7.5–12.5)
Platelets: 269 Thousand/uL (ref 140–400)
RBC: 4.08 Million/uL (ref 3.80–5.10)
RDW: 13.4 % (ref 11.0–15.0)
WBC: 4.6 Thousand/uL (ref 3.8–10.8)

## 2019-06-09 LAB — LIPID PANEL
Cholesterol: 144 mg/dL (ref <200)
HDL: 45 mg/dL — ABNORMAL LOW (ref >=50)
LDL Cholesterol (Calc): 86 mg/dL (calc)
Non-HDL Cholesterol (Calc): 99 mg/dL (calc) (ref <130)
Total CHOL/HDL Ratio: 3.2 (calc) (ref <5.0)
Triglycerides: 44 mg/dL (ref <150)

## 2019-06-09 LAB — IRON,TIBC AND FERRITIN PANEL
%SAT: 11 % (calc) — ABNORMAL LOW (ref 16–45)
Ferritin: 13 ng/mL — ABNORMAL LOW (ref 16–154)
Iron: 42 ug/dL (ref 40–190)
TIBC: 400 mcg/dL (calc) (ref 250–450)

## 2019-06-09 NOTE — Progress Notes (Signed)
April Wilkerson is a 24 y.o. female who presents to  [place] at [facility]  today, 06/09/19, seeking care for the following: . Annual physical   Physical exam:   Constitutional:  . VSS, see nurse notes . General Appearance: alert, well-developed, well-nourished, NAD Eyes: [name] Kitchen Normal lids and conjunctive, non-icteric sclera . PERRLA Ears, Nose, Mouth, Throat: . Mask in place  . Normal external auditory canal and TM bilaterally Neck: . No masses, trachea midline . No thyroid enlargement/tenderness/mass appreciated Respiratory: . Normal respiratory effort . Breath sounds normal, no wheeze/rhonchi/rales Cardiovascular: . S1/S2 normal, no murmur/rub/gallop auscultated . No lower extremity edema Gastrointestinal: . Nontender, no masses . No hepatomegaly, no splenomegaly . No hernia appreciated Musculoskeletal:  . Gait normal . No clubbing/cyanosis of digits Neurological: . No cranial nerve deficit on limited exam . Motor and sensation intact and symmetric Psychiatric: . Normal judgment/insight . Normal mood and affect     ASSESSMENT & PLAN with other pertinent history/findings:  The primary encounter diagnosis was Annual physical exam. Diagnoses of Anemia, unspecified type, Mild intermittent chronic asthma without complication, and Family history of breast cancer were also pertinent to this visit.   Pt will consider genetic referral for FH breast CA in mom Abstinent, no BC or STI testing needed  Labs as below     Patient Instructions  General Preventive Care  Most recent routine screening lipids/other labs: ordered today  Everyone should have blood pressure checked once per year.   Tobacco: don't!  Alcohol: responsible moderation is ok for most adults - if you have concerns about your alcohol intake, please talk to me!   Exercise: as tolerated to reduce risk of cardiovascular disease and diabetes. Strength  training will also prevent osteoporosis.   Mental health: if need for mental health care (medicines, counseling, other), or concerns about moods, please let me know!   Sexual health: if need for STD testing, or if concerns with libido/pain problems, please let me know! If you need to discuss your birth control options, please let me know!   Advanced Directive: Living Will and/or Healthcare Power of Attorney recommended for all adults, regardless of age or health.  Vaccines  Flu vaccine: recommended for almost everyone, every fall.   Shingles vaccine: Shingrix recommended age 62.  Pneumonia vaccines: Prevnar and Pneumovax age 74  Tetanus booster: Tdap recommended every 10 years. Due 2030  HPV vaccine: all done!  COVID: recommended as soon as you're eligible! Please follow [email or web address] for updates on eligibility and vaccination appointment. As of now, we do not anticipate our clinic having vaccines anytime soon.  Cancer screenings   Colon cancer screening: recommended for everyone at age 58  Breast cancer screening: given family history, should consider genetic testing, or start routine breast MRI at age 17   Cervical cancer screening: Pap due 10/2020  Lung cancer screening: not needed for non-somkers  Infection screenings . HIV: recommended screening at least once age 77-65, more often as needed. . Gonorrhea/Chlamydia: screening as needed . Hepatitis C: recommended for anyone born 79-1965 . TB: certain at-risk populations, or depending on work requirements and/or travel history Other . Bone Density Test: recommended for women at age 23         Orders Placed This Encounter  Procedures  . CBC  . COMPLETE METABOLIC PANEL WITH GFR  . Lipid panel  . Fe+TIBC+Fer    No orders of the defined types were placed in this encounter.  Follow-up instructions: Return in about 1 year (around 06/08/2020) for [place] (call week prior to visit for lab  orders).                                         BP 131/74 (BP Location: Left Arm, Patient Position: Sitting, Cuff Size: Normal)   Pulse 65   Temp 98.2 F (36.8 C) (Oral)   Wt 196 lb 0.6 oz (88.9 kg)   BMI 33.65 kg/m   Current Meds  Medication Sig  . albuterol (VENTOLIN HFA) 108 (90 Base) MCG/ACT inhaler Inhale 1-2 puffs into the lungs every 4 (four) hours as needed for wheezing or shortness of breath.  [name] Kitchen aspirin-acetaminophen-caffeine (EXCEDRIN MIGRAINE) 250-250-65 MG tablet Take 1-2 tablets by mouth every 6 (six) hours as needed for headache.  . MULTIPLE VITAMIN PO Take 1 tablet by mouth daily.    No results found for this or any previous visit (from the past 72 hour(s)).  No results found.     All questions at time of visit were answered - patient instructed to contact office with any additional concerns or updates.  ER/RTC precautions were reviewed with the patient.  Please note: voice recognition software was used to produce this document, and typos may escape review. Please contact Dr. Lyn Hollingshead for any needed clarifications.

## 2019-06-09 NOTE — Patient Instructions (Addendum)
General Preventive Care  Most recent routine screening lipids/other labs: ordered today  Everyone should have blood pressure checked once per year.   Tobacco: don't!  Alcohol: responsible moderation is ok for most adults - if you have concerns about your alcohol intake, please talk to me!   Exercise: as tolerated to reduce risk of cardiovascular disease and diabetes. Strength training will also prevent osteoporosis.   Mental health: if need for mental health care (medicines, counseling, other), or concerns about moods, please let me know!   Sexual health: if need for STD testing, or if concerns with libido/pain problems, please let me know! If you need to discuss your birth control options, please let me know!   Advanced Directive: Living Will and/or Healthcare Power of Attorney recommended for all adults, regardless of age or health.  Vaccines  Flu vaccine: recommended for almost everyone, every fall.   Shingles vaccine: Shingrix recommended age 23.  Pneumonia vaccines: Prevnar and Pneumovax age 31  Tetanus booster: Tdap recommended every 10 years. Due 2030  HPV vaccine: all done!  COVID: recommended as soon as you're eligible! Please follow [email or web address] for updates on eligibility and vaccination appointment. As of now, we do not anticipate our clinic having vaccines anytime soon.  Cancer screenings   Colon cancer screening: recommended for everyone at age 69  Breast cancer screening: given family history, should consider genetic testing, or start routine breast MRI at age 83   Cervical cancer screening: Pap due 10/2020  Lung cancer screening: not needed for non-somkers  Infection screenings . HIV: recommended screening at least once age 34-65, more often as needed. . Gonorrhea/Chlamydia: screening as needed . Hepatitis C: recommended for anyone born [birth year]-1965 . TB: certain at-risk populations, or depending on work requirements and/or travel history Other . Bone  Density Test: recommended for women at age 52

## 2019-08-05 ENCOUNTER — Other Ambulatory Visit: Payer: Self-pay | Admitting: Physician Assistant

## 2020-02-08 ENCOUNTER — Encounter: Payer: Self-pay | Admitting: Osteopathic Medicine

## 2020-02-08 DIAGNOSIS — F418 Other specified anxiety disorders: Secondary | ICD-10-CM

## 2020-02-08 NOTE — Telephone Encounter (Signed)
Pended referral for [email or web address]

## 2020-03-22 ENCOUNTER — Ambulatory Visit: Payer: 59 | Admitting: Professional

## 2020-05-13 ENCOUNTER — Ambulatory Visit (INDEPENDENT_AMBULATORY_CARE_PROVIDER_SITE_OTHER): Payer: 59 | Admitting: Professional

## 2020-05-13 DIAGNOSIS — F4323 Adjustment disorder with mixed anxiety and depressed mood: Secondary | ICD-10-CM

## 2020-05-17 ENCOUNTER — Encounter: Payer: Self-pay | Admitting: Osteopathic Medicine

## 2020-05-18 ENCOUNTER — Telehealth: Payer: 59 | Admitting: Physician Assistant

## 2020-05-18 ENCOUNTER — Encounter: Payer: Self-pay | Admitting: Physician Assistant

## 2020-05-18 VITALS — Ht 64.0 in | Wt 178.0 lb

## 2020-05-18 DIAGNOSIS — R109 Unspecified abdominal pain: Secondary | ICD-10-CM | POA: Diagnosis not present

## 2020-05-18 DIAGNOSIS — R110 Nausea: Secondary | ICD-10-CM

## 2020-05-18 DIAGNOSIS — R519 Headache, unspecified: Secondary | ICD-10-CM | POA: Diagnosis not present

## 2020-05-18 DIAGNOSIS — Z131 Encounter for screening for diabetes mellitus: Secondary | ICD-10-CM

## 2020-05-18 DIAGNOSIS — R5383 Other fatigue: Secondary | ICD-10-CM | POA: Diagnosis not present

## 2020-05-18 DIAGNOSIS — R5381 Other malaise: Secondary | ICD-10-CM

## 2020-05-18 DIAGNOSIS — R197 Diarrhea, unspecified: Secondary | ICD-10-CM

## 2020-05-18 DIAGNOSIS — D649 Anemia, unspecified: Secondary | ICD-10-CM

## 2020-05-18 NOTE — Progress Notes (Signed)
Started Monday AM Woke up with upset stomach/pain/nausea/headache Didn't eat much, felt lightheaded/clammy/tingling and tightness in hands/SOB - lasted about 20 minutes Some stomach pains with eating last night  Today woke up with sore throat, does have a history of sinus issues At home Covid test negative Stomach better today, a little constipated  Fatigue over the last two days

## 2020-05-18 NOTE — Progress Notes (Signed)
Patient ID: April Wilkerson, female   DOB: October 27, 1995, 25 y.o.   MRN: [id number] .[name] KitchenVirtual Visit via Video Note  I connected with April Wilkerson on 05/20/2020 at 10:50 AM EST by a video enabled telemedicine application and verified that I am speaking with the correct person using two identifiers.  Location: Patient: home Provider: clinic  .[name] KitchenParticipating in visit:  Patient: April Wilkerson Provider: Tandy Gaw [facility]   I discussed the limitations of evaluation and management by telemedicine and the availability of in person appointments. The patient expressed understanding and agreed to proceed.  History of Present Illness: Patient is a 25 year old female with asthma and constipation history who presents via video to the clinic with upset stomach, nausea, headache that started Monday morning.  Past few days she has not eaten a lot and at a few points has felt a little lightheaded, clammy, tingling.  She had one episode of shortness of breath/tingling/clammy that last about 20 minutes. She does have hx of anxiety but never had anxiety attacks. Denies any diarrhea. She has more hard stools than anything. This morning she had sore throat. Food causes some abdominal cramping. covid testing negative today. She feels a little better. She is still tired. She is just resting and staying hydrated.     .. Active Ambulatory Problems    Diagnosis Date Noted  . Asthma, chronic 10/13/2013  . Faintness 10/13/2013  . Acne 02/15/2014  . Family history of breast cancer 10/14/2014  . Anemia 10/15/2014  . Hypokalemia 10/15/2014  . Constipation 10/28/2014   Resolved Ambulatory Problems    Diagnosis Date Noted  . No Resolved Ambulatory Problems   Past Medical History:  Diagnosis Date  . Palpitations    Reviewed med, allergy, problem list.   Observations/Objective: No acute distress Normal mood and appearance No labored breathing  .[name] Kitchen Today's Vitals   05/18/20 1043  Weight: 178 lb (80.7  kg)  Height: 5\' 4"  (1.626 m)   Body mass index is 30.55 kg/m.    Assessment and Plan: [name] KitchenMiracle was seen today for sore throat.  Diagnoses and all orders for this visit:  Nausea  Fatigue, unspecified type  Acute nonintractable headache, unspecified headache type  Abdominal pain, unspecified abdominal location   Over video seemed to be doing a little better. covid rapid negative and pt is vaccinated. Ok to go back to work in 24-48 hours. Discussed symptomatic care, rest, hydration. Consider miralax for constipation symptoms.  Follow up or call with any new symptoms especially SOB.  Pt called back and stated her fatigue had increased. Suggested labs and covid testing again.    Follow Up Instructions:    I discussed the assessment and treatment plan with the patient. The patient was provided an opportunity to ask questions and all were answered. The patient agreed with the plan and demonstrated an understanding of the instructions.   The patient was advised to call back or seek an in-person evaluation if the symptoms worsen or if the condition fails to improve as anticipated.   02-05-1980, PA-C

## 2020-05-20 ENCOUNTER — Encounter: Payer: Self-pay | Admitting: Physician Assistant

## 2020-05-20 NOTE — Telephone Encounter (Signed)
Hemoglobin normal but iron sat and serum iron low. You do need to be taking iron daily. Are you taking any?  Waiting on IGM covid testing. IGG shows antibodies. Kidney, liver glucose look good. How areyou feeling?

## 2020-05-23 ENCOUNTER — Encounter: Payer: Self-pay | Admitting: Physician Assistant

## 2020-05-23 LAB — TSH: TSH: 1.21 u[IU]/mL (ref 0.450–4.500)

## 2020-05-23 LAB — CBC WITH DIFFERENTIAL/PLATELET
Basophils Absolute: 0 10*3/uL (ref 0.0–0.2)
Basos: 1 %
EOS (ABSOLUTE): 0.1 10*3/uL (ref 0.0–0.4)
Eos: 1 %
Hematocrit: 40.5 % (ref 34.0–46.6)
Hemoglobin: 12.9 g/dL (ref 11.1–15.9)
Immature Grans (Abs): 0 10*3/uL (ref 0.0–0.1)
Immature Granulocytes: 0 %
Lymphocytes Absolute: 2.1 10*3/uL (ref 0.7–3.1)
Lymphs: 33 %
MCH: 27.7 pg (ref 26.6–33.0)
MCHC: 31.9 g/dL (ref 31.5–35.7)
MCV: 87 fL (ref 79–97)
Monocytes Absolute: 0.4 10*3/uL (ref 0.1–0.9)
Monocytes: 6 %
Neutrophils Absolute: 3.8 10*3/uL (ref 1.4–7.0)
Neutrophils: 59 %
Platelets: 293 10*3/uL (ref 150–450)
RBC: 4.65 x10E6/uL (ref 3.77–5.28)
RDW: 13.4 % (ref 11.7–15.4)
WBC: 6.4 10*3/uL (ref 3.4–10.8)

## 2020-05-23 LAB — COMPREHENSIVE METABOLIC PANEL
ALT: 12 IU/L (ref 0–32)
AST: 17 IU/L (ref 0–40)
Albumin/Globulin Ratio: 1.4 (ref 1.2–2.2)
Albumin: 4.3 g/dL (ref 3.9–5.0)
Alkaline Phosphatase: 70 IU/L (ref 44–121)
BUN/Creatinine Ratio: 11 (ref 9–23)
BUN: 9 mg/dL (ref 6–20)
Bilirubin Total: 0.2 mg/dL (ref 0.0–1.2)
CO2: 20 mmol/L (ref 20–29)
Calcium: 9.6 mg/dL (ref 8.7–10.2)
Chloride: 105 mmol/L (ref 96–106)
Creatinine, Ser: 0.82 mg/dL (ref 0.57–1.00)
Globulin, Total: 3.1 g/dL (ref 1.5–4.5)
Glucose: 103 mg/dL — ABNORMAL HIGH (ref 65–99)
Potassium: 4.7 mmol/L (ref 3.5–5.2)
Sodium: 139 mmol/L (ref 134–144)
Total Protein: 7.4 g/dL (ref 6.0–8.5)
eGFR: 102 mL/min/{1.73_m2} (ref >59)

## 2020-05-23 LAB — SAR COV2 SEROLOGY (COVID19)AB(IGG),IA
SARS-CoV-2 Semi-Quant IgG Ab: >800 AU/mL (ref <13.0)
SARS-CoV-2 Spike Ab Interp: POSITIVE

## 2020-05-23 LAB — SARS-COV-2 ANTIBODY, IGM

## 2020-05-23 LAB — IRON,TIBC AND FERRITIN PANEL
Ferritin: 21 ng/mL (ref 15–150)
Iron Saturation: 8 % — CL (ref 15–55)
Iron: 30 ug/dL (ref 27–159)
Total Iron Binding Capacity: 383 ug/dL (ref 250–450)
UIBC: 353 ug/dL (ref 131–425)

## 2020-05-23 NOTE — Telephone Encounter (Signed)
Thyroid normal.   How are you feeling today?

## 2020-05-26 ENCOUNTER — Ambulatory Visit: Payer: 59 | Admitting: Professional

## 2020-06-04 ENCOUNTER — Ambulatory Visit (INDEPENDENT_AMBULATORY_CARE_PROVIDER_SITE_OTHER): Payer: 59 | Admitting: Professional

## 2020-06-04 DIAGNOSIS — F4323 Adjustment disorder with mixed anxiety and depressed mood: Secondary | ICD-10-CM

## 2020-06-07 ENCOUNTER — Ambulatory Visit: Payer: 59 | Admitting: Professional

## 2020-06-13 ENCOUNTER — Other Ambulatory Visit: Payer: Self-pay

## 2020-06-13 ENCOUNTER — Encounter: Payer: Self-pay | Admitting: Osteopathic Medicine

## 2020-06-13 ENCOUNTER — Ambulatory Visit (INDEPENDENT_AMBULATORY_CARE_PROVIDER_SITE_OTHER): Payer: 59 | Admitting: Osteopathic Medicine

## 2020-06-13 VITALS — BP 146/87 | HR 98 | Temp 98.3°F | Wt 189.0 lb

## 2020-06-13 DIAGNOSIS — E611 Iron deficiency: Secondary | ICD-10-CM

## 2020-06-13 DIAGNOSIS — Z0000 Encounter for general adult medical examination without abnormal findings: Secondary | ICD-10-CM | POA: Diagnosis not present

## 2020-06-13 DIAGNOSIS — J4520 Mild intermittent asthma, uncomplicated: Secondary | ICD-10-CM

## 2020-06-13 DIAGNOSIS — N921 Excessive and frequent menstruation with irregular cycle: Secondary | ICD-10-CM

## 2020-06-13 DIAGNOSIS — E559 Vitamin D deficiency, unspecified: Secondary | ICD-10-CM

## 2020-06-13 NOTE — Progress Notes (Signed)
April Wilkerson is a 25 y.o. female who presents to  [place] at [facility]  today, 06/13/20, seeking care for the following:  . Annual physical  . Spotting between periods for a few days, couple weeks after period, not soaking pad/tampon but needing to wear a light pad throughout the day, would probably soak a pantiliner.      ASSESSMENT & PLAN with other pertinent findings:  The primary encounter diagnosis was Annual physical exam. Diagnoses of Iron deficiency, Mild intermittent chronic asthma without complication, Irregular intermenstrual bleeding, and Vitamin D deficiency were also pertinent to this visit.    Patient Instructions  General Preventive Care  Most recent routine screening labs: recent labs ok.   Blood pressure goal 130/80 or less.   Tobacco: don't!  Alcohol: responsible moderation is ok for most adults - if you have concerns about your alcohol intake, please talk to me!   Exercise: as tolerated to reduce risk of cardiovascular disease and diabetes. Strength training will also prevent osteoporosis.   Mental health: if need for mental health care (medicines, counseling, other), or concerns about moods, please let me know!   Sexual / Reproductive health: if need for STD testing, or if concerns with libido/pain problems, please let me know! If you need to discuss family planning, please let me know!   Advanced Directive: Living Will and/or Healthcare Power of Attorney recommended for all adults, regardless of age or health.  Vaccines  Flu vaccine: for almost everyone, every fall.   Tetanus booster: every 10 years / 3rd trimester of pregnancy  HPV vaccine: done!  COVID vaccine: THANKS for getting your vaccine! :)  Cancer screenings   Colon cancer screening: at age 63.   Breast cancer screening: given family history, should consider genetic testing, or start routine breast MRI at age 70   Cervical cancer  screening: Pap, if normal, every 3 years for people under 30 Infection screenings  . HIV: recommended screening at least once age 2-65, more often as needed.  . Gonorrhea/Chlamydia: screening as needed, though many insurances require testing for anyone on birth control pills. . Hepatitis C: recommended once for everyone age 52-75 . TB: certain at-risk populations, or depending on work requirements and/or travel history   Orders Placed This Encounter  Procedures  . US Transvaginal Non-OB  . US Pelvis Complete  . CBC  . Lipid panel  . TSH  . VITAMIN D 25 Hydroxy (Vit-D Deficiency, Fractures)  . Fe+TIBC+Fer    No orders of the defined types were placed in this encounter.    See below for relevant physical exam findings  See below for recent lab and imaging results reviewed  Medications, allergies, PMH, PSH, SocH, FamH reviewed below    Follow-up instructions: Return in about 1 year (around 06/13/2021) for ANNUAL CHECK-UP - SEE [place] SOONER FOR *PAP ONLY* 10/2020. 6 weeks labs - MyChart reminder set. Pt to monitor BP                                         Exam:  BP (!) 146/87 (BP Location: Left Arm, Patient Position: Sitting, Cuff Size: Normal)   Pulse 98   Temp 98.3 F (36.8 C) (Oral)   Wt 189 lb 0.6 oz (85.7 kg)   BMI 32.45 kg/m   Constitutional: VS see above. General Appearance: alert, well-developed, well-nourished, NAD  Neck: No masses, trachea midline.   Respiratory: Normal respiratory effort. no wheeze, no rhonchi, no rales  Cardiovascular: S1/S2 normal, no murmur, no rub/gallop auscultated. RRR.   Musculoskeletal: Gait normal. Symmetric and independent movement of all extremities  Abdominal: non-tender, non-distended, no appreciable organomegaly, neg Murphy's, BS WNLx4  Neurological: Normal balance/coordination. No tremor.  Skin: warm, dry, intact.   Psychiatric: Normal judgment/insight. Normal mood and affect. Oriented  x3.   Current Meds  Medication Sig  . albuterol (VENTOLIN HFA) 108 (90 Base) MCG/ACT inhaler Inhale 1-2 puffs into the lungs every 4 (four) hours as needed for wheezing or shortness of breath.  [name] Kitchen aspirin-acetaminophen-caffeine (EXCEDRIN MIGRAINE) 250-250-65 MG tablet Take 1-2 tablets by mouth every 6 (six) hours as needed for headache.  . ferrous sulfate 325 (65 FE) MG tablet Take 325 mg by mouth daily with breakfast.  . MULTIPLE VITAMIN PO Take 1 tablet by mouth daily.  . Multiple Vitamins-Minerals (VITAMIN D3 COMPLETE PO) Take by mouth.    No Known Allergies  Patient Active Problem List   Diagnosis Date Noted  . Constipation 10/28/2014  . Anemia 10/15/2014  . Hypokalemia 10/15/2014  . Family history of breast cancer 10/14/2014  . Acne 02/15/2014  . Chronic asthma 10/13/2013  . Faintness 10/13/2013    Family History  Problem Relation Age of Onset  . Cancer Mother        breast cancer, not sure what age diagnosed   . Hypertension Father     Social History   Tobacco Use  Smoking Status Never Smoker  Smokeless Tobacco Never Used    No past surgical history on file.  Immunization History  Administered Date(s) Administered  . DTaP 10/02/1995, 12/10/1995, 02/04/1996, 02/23/1997  . HPV Quadrivalent 04/03/2010, 11/06/2011, 07/15/2012  . Hepatitis A 12/01/2007, 04/03/2010  . Hepatitis B 1995-08-21, 10/02/1995, 06/12/1996  . HiB (PRP-OMP) 10/02/1995, 12/10/1995, 02/04/1996, 02/23/1997  . IPV 10/02/1995, 12/10/1995, 09/14/1996, 01/13/2013  . Influenza,inj,Quad PF,6+ Mos 06/30/2015, 05/13/2018  . Influenza-Unspecified 03/02/2000, 01/22/2008, 01/23/2008, 04/09/2011, 01/13/2013, 01/17/2017, 01/01/2019  . MMR 09/14/1996, 01/13/2013  . Meningococcal Conjugate 12/01/2007  . Meningococcal Polysaccharide 11/06/2011  . OPV 09/14/1996  . PFIZER(Purple Top)SARS-COV-2 Vaccination 07/29/2019, 08/19/2019, 03/16/2020  . PPD Test 10/12/2014, 07/04/2015, 07/13/2015  . Tdap 10/24/2006,  05/13/2018  . Varicella 02/23/1997, 12/01/2007    Recent Results (from the past 2160 hour(s))  CBC with Differential/Platelet     Status: None   Collection Time: 05/19/20  8:52 AM  Result Value Ref Range   WBC 6.4 3.4 - 10.8 x10E3/uL   RBC 4.65 3.77 - 5.28 x10E6/uL   Hemoglobin 12.9 11.1 - 15.9 g/dL   Hematocrit 40.5 34.0 - 46.6 %   MCV 87 79 - 97 fL   MCH 27.7 26.6 - 33.0 pg   MCHC 31.9 31.5 - 35.7 g/dL   RDW 13.4 11.7 - 15.4 %   Platelets 293 150 - 450 x10E3/uL   Neutrophils 59 Not Estab. %   Lymphs 33 Not Estab. %   Monocytes 6 Not Estab. %   Eos 1 Not Estab. %   Basos 1 Not Estab. %   Neutrophils Absolute 3.8 1.4 - 7.0 x10E3/uL   Lymphocytes Absolute 2.1 0.7 - 3.1 x10E3/uL   Monocytes Absolute 0.4 0.1 - 0.9 x10E3/uL   EOS (ABSOLUTE) 0.1 0.0 - 0.4 x10E3/uL   Basophils Absolute 0.0 0.0 - 0.2 x10E3/uL   Immature Granulocytes 0 Not Estab. %   Immature Grans (Abs) 0.0 0.0 - 0.1 x10E3/uL  TSH  Status: None   Collection Time: 05/19/20  8:52 AM  Result Value Ref Range   TSH 1.210 0.450 - 4.500 uIU/mL  Fe+TIBC+Fer     Status: Abnormal   Collection Time: 05/19/20  8:52 AM  Result Value Ref Range   Total Iron Binding Capacity 383 250 - 450 ug/dL   UIBC 353 131 - 425 ug/dL   Iron 30 27 - 159 ug/dL   Iron Saturation 8 (LL) 15 - 55 %   Ferritin 21 15 - 150 ng/mL  SARS-CoV-2 Antibody, IgM     Status: None   Collection Time: 05/19/20  8:52 AM  Result Value Ref Range   SARS-CoV-2 Antibody, IgM CANCELED     Comment: Test not performed. Unable to perform test due to current unavailability of reagents or discontinuation of test. Effective May 17, 2020: Due to reagent availability issues this test cannot be performed and a date for availability is not known at this time. [facility] offers several test codes to support SARS-CoV-2 antibody testing: 235573 SARS-CoV-2 Semi-Quantitative Total Antibody, Spike [id number] SARS-CoV-2 Semi-Quantitative IgG Antibody, Spike Temporary referral  test 220254 SARS-CoV-2 IgM, Spike performed at [facility] is available for use if warranted. Please contact [place] if you would like to add on the referral K[facility] test [id number] SARS-CoV-2 IgM, Spike. This assay detects antibodies against SARS-CoV-2 spike protein including the receptor binding domain (RBD).  Result canceled by the ancillary.   SAR CoV2 Serology (COVID 19)AB(IGG)IA     Status: None   Collection Time: 05/19/20  8:52 AM  Result Value Ref Range   SARS-CoV-2 Semi-Quant IgG Ab >800.0 Neg <13.0 AU/mL   SARS-CoV-2 Spike Ab Interp Positive     Comment: Antibodies against the SARS-CoV-2 spike protein, including the receptor binding domain (RBD) were detected. It is not yet known what level of antibody to SARS-CoV-2 spike protein correlates to immunity against developing symptomatic SARS-CoV-2 disease. This assay was performed using [employer](R) SARS-CoV-2 Trimeric S IgG assay.   Comprehensive metabolic panel     Status: Abnormal   Collection Time: 05/19/20  8:52 AM  Result Value Ref Range   Glucose 103 (H) 65 - 99 mg/dL   BUN 9 6 - 20 mg/dL   Creatinine, Ser 0.82 0.57 - 1.00 mg/dL   eGFR 102 >59 mL/min/1.73    Comment: **In accordance with recommendations from the NKF-ASN Task force,**   [facility] has updated its eGFR calculation to the 2021 CKD-EPI   creatinine equation that estimates kidney function without a race   variable.    BUN/Creatinine Ratio 11 9 - 23   Sodium 139 134 - 144 mmol/L   Potassium 4.7 3.5 - 5.2 mmol/L   Chloride 105 96 - 106 mmol/L   CO2 20 20 - 29 mmol/L   Calcium 9.6 8.7 - 10.2 mg/dL   Total Protein 7.4 6.0 - 8.5 g/dL   Albumin 4.3 3.9 - 5.0 g/dL   Globulin, Total 3.1 1.5 - 4.5 g/dL   Albumin/Globulin Ratio 1.4 1.2 - 2.2   Bilirubin Total 0.2 0.0 - 1.2 mg/dL   Alkaline Phosphatase 70 44 - 121 IU/L   AST 17 0 - 40 IU/L   ALT 12 0 - 32 IU/L    No results found.     All questions at time of visit were answered - patient  instructed to contact office with any additional concerns or updates. ER/RTC precautions were reviewed with the patient as applicable.   Please note: manual typing as well as  voice recognition software may have been used to produce this document - typos may escape review. Please contact Dr. Sheppard Coil for any needed clarifications.

## 2020-06-13 NOTE — Patient Instructions (Addendum)
General Preventive Care  Most recent routine screening labs: recent labs ok.   Blood pressure goal 130/80 or less.   Tobacco: don't!  Alcohol: responsible moderation is ok for most adults - if you have concerns about your alcohol intake, please talk to me!   Exercise: as tolerated to reduce risk of cardiovascular disease and diabetes. Strength training will also prevent osteoporosis.   Mental health: if need for mental health care (medicines, counseling, other), or concerns about moods, please let me know!   Sexual / Reproductive health: if need for STD testing, or if concerns with libido/pain problems, please let me know! If you need to discuss family planning, please let me know!   Advanced Directive: Living Will and/or Healthcare Power of Attorney recommended for all adults, regardless of age or health.  Vaccines  Flu vaccine: for almost everyone, every fall.   Tetanus booster: every 10 years / 3rd trimester of pregnancy  HPV vaccine: done!  COVID vaccine: THANKS for getting your vaccine! :)  Cancer screenings   Colon cancer screening: at age 15.   Breast cancer screening: given family history, should consider genetic testing, or start routine breast MRI at age 52   Cervical cancer screening: Pap, if normal, every 3 years for people under 30 Infection screenings  . HIV: recommended screening at least once age 58-65, more often as needed.  . Gonorrhea/Chlamydia: screening as needed, though many insurances require testing for anyone on birth control pills. . Hepatitis C: recommended once for everyone age 80-75 . TB: certain at-risk populations, or depending on work requirements and/or travel history

## 2020-06-14 ENCOUNTER — Ambulatory Visit (INDEPENDENT_AMBULATORY_CARE_PROVIDER_SITE_OTHER): Payer: 59 | Admitting: Professional

## 2020-06-14 DIAGNOSIS — F4323 Adjustment disorder with mixed anxiety and depressed mood: Secondary | ICD-10-CM

## 2020-06-15 ENCOUNTER — Other Ambulatory Visit: Payer: Self-pay

## 2020-06-15 ENCOUNTER — Ambulatory Visit (INDEPENDENT_AMBULATORY_CARE_PROVIDER_SITE_OTHER): Payer: 59

## 2020-06-15 DIAGNOSIS — N921 Excessive and frequent menstruation with irregular cycle: Secondary | ICD-10-CM | POA: Diagnosis not present

## 2020-06-16 NOTE — Addendum Note (Signed)
Addended by: Deirdre Pippins on: 06/16/2020 08:28 AM   Modules accepted: Orders

## 2020-06-18 ENCOUNTER — Ambulatory Visit: Payer: 59 | Admitting: Professional

## 2020-06-20 ENCOUNTER — Ambulatory Visit (INDEPENDENT_AMBULATORY_CARE_PROVIDER_SITE_OTHER): Payer: 59 | Admitting: Professional

## 2020-06-20 DIAGNOSIS — F4323 Adjustment disorder with mixed anxiety and depressed mood: Secondary | ICD-10-CM

## 2020-06-28 ENCOUNTER — Ambulatory Visit (INDEPENDENT_AMBULATORY_CARE_PROVIDER_SITE_OTHER): Payer: 59 | Admitting: Professional

## 2020-06-28 DIAGNOSIS — F4323 Adjustment disorder with mixed anxiety and depressed mood: Secondary | ICD-10-CM | POA: Diagnosis not present

## 2020-07-12 ENCOUNTER — Ambulatory Visit: Payer: 59 | Admitting: Professional

## 2020-07-14 ENCOUNTER — Ambulatory Visit (INDEPENDENT_AMBULATORY_CARE_PROVIDER_SITE_OTHER): Payer: 59 | Admitting: Obstetrics & Gynecology

## 2020-07-14 ENCOUNTER — Other Ambulatory Visit: Payer: Self-pay

## 2020-07-14 ENCOUNTER — Encounter: Payer: Self-pay | Admitting: Obstetrics & Gynecology

## 2020-07-14 VITALS — BP 122/80 | HR 90 | Resp 16 | Ht 64.5 in | Wt 186.0 lb

## 2020-07-14 DIAGNOSIS — N939 Abnormal uterine and vaginal bleeding, unspecified: Secondary | ICD-10-CM | POA: Diagnosis not present

## 2020-07-14 DIAGNOSIS — N920 Excessive and frequent menstruation with regular cycle: Secondary | ICD-10-CM

## 2020-07-14 MED ORDER — NAPROXEN SODIUM 550 MG PO TABS: 550.0000 mg | ORAL_TABLET | Freq: Two times a day (BID) | ORAL | 3 refills | Status: DC

## 2020-07-14 NOTE — Patient Instructions (Signed)
Naproxen for now.  Maybe add Lysteda  If does not work, consider low dose birth control pills (consider genetic testing for breast cancer given history)

## 2020-07-14 NOTE — Progress Notes (Signed)
GYNECOLOGY OFFICE VISIT NOTE  History:   April Wilkerson is a 25 y.o. G0P0000 here today for report of abnormal menstrual bleeding pattern. Over the last 3-4 months, after her periods which she reported are usually heavy, she still had some dark red bleeding for about one week associated with clots and cramping.  This has happened one other time in the past and it self-resolved. Not currently sexually active. She saw her PCP, had negative evaluation with normal CBC and TSH. Pelvic ultrasound on 06/15/20 was only remarkable for 24 mm endometrial stripe, otherwise normal. Patient has never bene on birth control pills, she is hesitant about this given that her mother died for breast cancer at age 82. No genetic testing.   She denies any current abnormal vaginal discharge, bleeding, pelvic pain or other concerns.    Past Medical History:  Diagnosis Date  . Anemia   . Asthma, chronic 10/13/2013  . Palpitations   . Vitamin D deficiency     History reviewed. No pertinent surgical history.  The following portions of the patient's history were reviewed and updated as appropriate: allergies, current medications, past family history, past medical history, past social history, past surgical history and problem list.   Health Maintenance:  Normal pap on 11/14/2017.   Review of Systems:  Pertinent items noted in HPI and remainder of comprehensive ROS otherwise negative.  Physical Exam:  BP 122/80   Pulse 90   Resp 16   Ht 5' 4.5" (1.638 m)   Wt 186 lb (84.4 kg)   LMP 06/27/2020   BMI 31.43 kg/m  CONSTITUTIONAL: Well-developed, well-nourished female in no acute distress.  HEENT:  Normocephalic, atraumatic. External right and left ear normal. No scleral icterus.  NECK: Normal range of motion, supple, no masses noted on observation SKIN: No rash noted. Not diaphoretic. No erythema. No pallor. MUSCULOSKELETAL: Normal range of motion. No edema noted. NEUROLOGIC: Alert and oriented to person,  place, and time. Normal muscle tone coordination. No cranial nerve deficit noted. PSYCHIATRIC: Normal mood and affect. Normal behavior. Normal judgment and thought content. CARDIOVASCULAR: Normal heart rate noted RESPIRATORY: Effort and breath sounds normal, no problems with respiration noted ABDOMEN: No masses noted. No other overt distention noted.   PELVIC: Deferred  Labs and Imaging No results found for this or any previous visit (from the past 168 hour(s)). US PELVIC COMPLETE WITH TRANSVAGINAL  Result Date: 06/15/2020 CLINICAL DATA:  Irregular bleeding EXAM: TRANSABDOMINAL AND TRANSVAGINAL ULTRASOUND OF PELVIS TECHNIQUE: Both transabdominal and transvaginal ultrasound examinations of the pelvis were performed. Transabdominal technique was performed for global imaging of the pelvis including uterus, ovaries, adnexal regions, and pelvic cul-de-sac. It was necessary to proceed with endovaginal exam following the transabdominal exam to visualize the uterus endometrium ovaries. COMPARISON:  None FINDINGS: Uterus Measurements: 9.8 x 4.3 x 4.5 cm = volume: 95 mL. No fibroids or other mass visualized. Endometrium Thickness: 24.4 mm. Heterogenous endometrial thickening without focal mass. Right ovary Measurements: 3.3 x 2 x 2.3 cm = volume: 8 mL. Normal appearance/no adnexal mass. Left ovary Measurements: 3.6 x 2.3 x 5 3 cm = volume: 12 mL. Normal appearance/no adnexal mass. Other findings Small free fluid. IMPRESSION: 1. Endometrial thickness of 24.4 mm. If bleeding remains unresponsive to hormonal or medical therapy, focal lesion work-up with sonohysterogram should be considered. Endometrial biopsy should also be considered in pre-menopausal patients at high risk for endometrial carcinoma. (Ref: Radiological Reasoning: Algorithmic Workup of Abnormal Vaginal Bleeding with Endovaginal Sonography and Sonohysterography. AJR  2008; [id number]) 2. Small free fluid in the pelvis. Electronically Signed   By: Jasmine Pang M.D.   On: 06/15/2020 17:04      Assessment and Plan:      1. Menorrhagia with regular cycle 2. Abnormal uterine bleeding (AUB) Discussed nonhormonal management of AUB, recommended NSAIDs and also Lysteda.  She agreed to try NSAIDs, this was prescribed. Will research Lysteda.  If this does not work, may consider low dose birth control pills. Encouraged to undergo genetic testing given FH of breast cancer.  - naproxen sodium (ANAPROX DS) 550 MG tablet; Take 1 tablet (550 mg total) by mouth 2 (two) times daily with a meal.  Dispense: 30 tablet; Refill: 3  Routine preventative health maintenance measures emphasized. Please refer to After Visit Summary for other counseling recommendations.   Return in about 1 month (around 08/13/2020) for Followup of AUB (can be virtual if desired).    I spent 25 minutes dedicated to the care of this patient including pre-visit review of records, face to face time with the patient discussing her conditions and treatments and post visit ordering of testing.    Jaynie Collins, MD, FACOG Obstetrician & Gynecologist, [facility] for [employer], [facility]

## 2020-07-21 ENCOUNTER — Ambulatory Visit (INDEPENDENT_AMBULATORY_CARE_PROVIDER_SITE_OTHER): Payer: 59 | Admitting: Professional

## 2020-07-21 DIAGNOSIS — F4323 Adjustment disorder with mixed anxiety and depressed mood: Secondary | ICD-10-CM | POA: Diagnosis not present

## 2020-07-26 ENCOUNTER — Ambulatory Visit: Payer: 59 | Admitting: Professional

## 2020-07-27 ENCOUNTER — Encounter: Payer: Self-pay | Admitting: Obstetrics & Gynecology

## 2020-07-27 ENCOUNTER — Ambulatory Visit (HOSPITAL_COMMUNITY)
Admission: EM | Admit: 2020-07-27 | Discharge: 2020-07-27 | Disposition: A | Discharge status: Home / Self Care | Payer: 59 | Attending: Medical Oncology | Admitting: Medical Oncology

## 2020-07-27 ENCOUNTER — Encounter: Payer: Self-pay | Admitting: Osteopathic Medicine

## 2020-07-27 ENCOUNTER — Encounter (HOSPITAL_COMMUNITY): Payer: Self-pay

## 2020-07-27 ENCOUNTER — Other Ambulatory Visit: Payer: Self-pay

## 2020-07-27 DIAGNOSIS — J039 Acute tonsillitis, unspecified: Secondary | ICD-10-CM | POA: Diagnosis present

## 2020-07-27 LAB — POCT RAPID STREP A, ED / UC: Streptococcus, Group A Screen (Direct): NEGATIVE

## 2020-07-27 MED ORDER — LIDOCAINE VISCOUS HCL 2 % MT SOLN: 5.0000 mL | Freq: Four times a day (QID) | OROMUCOSAL | 0 refills | Status: DC | PRN

## 2020-07-27 NOTE — ED Triage Notes (Signed)
Pt in with c/o left tonsil swelling that she noticed yesterday   Pt states she has noticed white patches on in the back of her throat

## 2020-07-27 NOTE — ED Provider Notes (Signed)
[facility]    CSN: [id number] Arrival date & time: 07/27/20  1236      History   Chief Complaint Chief Complaint  Patient presents with  . tonsil swelling    HPI April Wilkerson is a 25 y.o. female.   HPI   Tonsil swelling: Pt reports tonsil swelling that she noticed yesterday. She also has seen white patches on her left tonsils. She rates pain 3/10 in nature and does not have any fevers, trouble swallowing, voice changes, trouble breathing or increased saliva production. She has tried warm tea for symptoms without much relief. One new sexual partner with low STI risk per pt.   History reviewed. No pertinent past medical history.  There are no problems to display for this patient.   History reviewed. No pertinent surgical history.  OB History   No obstetric history on file.      Home Medications    Prior to Admission medications   Medication Sig Start Date End Date Taking? Authorizing Provider  naproxen sodium (ANAPROX) 550 MG tablet Take 550 mg by mouth 2 (two) times daily. 07/14/20   [provider]    Family History History reviewed. No pertinent family history.  Social History Social History   Tobacco Use  . Smoking status: Never Smoker  . Smokeless tobacco: Never Used  Substance Use Topics  . Drug use: Never     Allergies   Patient has no known allergies.   Review of Systems Review of Systems  As stated above in HPI Physical Exam Triage Vital Signs ED Triage Vitals  Enc Vitals Group     BP 07/27/20 1431 (!) 134/92     Pulse Rate 07/27/20 1431 63     Resp 07/27/20 1431 17     Temp 07/27/20 1431 98.5 F (36.9 C)     Temp src --      SpO2 07/27/20 1431 100 %     Weight --      Height --      Head Circumference --      Peak Flow --      Pain Score 07/27/20 1428 1     Pain Loc --      Pain Edu? --      Excl. in GC? --    No data found.  Updated Vital Signs BP (!) 134/92   Pulse 63   Temp 98.5 F (36.9 C)    Resp 17   LMP 07/24/2020 (Exact Date)   SpO2 100%   Physical Exam Vitals and nursing note reviewed.  Constitutional:      General: She is not in acute distress.    Appearance: Normal appearance. She is not ill-appearing, toxic-appearing or diaphoretic.  HENT:     Head: Normocephalic and atraumatic.     Right Ear: Tympanic membrane normal. There is no impacted cerumen.     Left Ear: Tympanic membrane normal. There is no impacted cerumen.     Nose: Nose normal.     Mouth/Throat:     Mouth: Mucous membranes are moist.     Pharynx: Oropharyngeal exudate present. No posterior oropharyngeal erythema.  Eyes:     Extraocular Movements: Extraocular movements intact.     Pupils: Pupils are equal, round, and reactive to light.  Cardiovascular:     Rate and Rhythm: Normal rate and regular rhythm.     Pulses: Normal pulses.     Heart sounds: Normal heart sounds.  Pulmonary:     Breath  sounds: Normal breath sounds.  Musculoskeletal:     Cervical back: Normal range of motion and neck supple.  Lymphadenopathy:     Cervical: Cervical adenopathy present.  Skin:    General: Skin is warm.     Findings: No rash.  Neurological:     Mental Status: She is alert and oriented to person, place, and time.      UC Treatments / Results  Labs (all labs ordered are listed, but only abnormal results are displayed) Labs Reviewed - No data to display  EKG   Radiology No results found.  Procedures Procedures (including critical care time)  Medications Ordered in UC Medications - No data to display  Initial Impression / Assessment and Plan / UC Course  I have reviewed the triage vital signs and the nursing notes.  Pertinent labs & imaging results that were available during my care of the patient were reviewed by me and considered in my medical decision making (see chart for details).     New. Does not appear to be to be a peritonsillar abscess. Swabbing for strep. If negative I would  recommend screening for trich, GCC. If negative likely viral and we discussed red flag signs and symptoms.    Final Clinical Impressions(s) / UC Diagnoses   Final diagnoses:  None   Discharge Instructions   None    ED Prescriptions    None     PDMP not reviewed this encounter.   Rushie Chestnut, [place] 07/27/20 1554

## 2020-07-28 ENCOUNTER — Telehealth: Payer: 59 | Admitting: Family Medicine

## 2020-07-29 ENCOUNTER — Telehealth (HOSPITAL_COMMUNITY): Payer: Self-pay | Admitting: Emergency Medicine

## 2020-07-29 LAB — CULTURE, GROUP A STREP (THRC)

## 2020-07-29 LAB — CYTOLOGY, (ORAL, ANAL, URETHRAL) ANCILLARY ONLY
Chlamydia: NEGATIVE
Comment: NEGATIVE
Comment: NEGATIVE
Comment: NORMAL
Neisseria Gonorrhea: NEGATIVE
Trichomonas: NEGATIVE

## 2020-07-29 MED ORDER — PENICILLIN V POTASSIUM 500 MG PO TABS: 500.0000 mg | ORAL_TABLET | Freq: Two times a day (BID) | ORAL | 0 refills | Status: AC

## 2020-08-02 ENCOUNTER — Ambulatory Visit (INDEPENDENT_AMBULATORY_CARE_PROVIDER_SITE_OTHER): Payer: 59 | Admitting: Professional

## 2020-08-02 DIAGNOSIS — F4323 Adjustment disorder with mixed anxiety and depressed mood: Secondary | ICD-10-CM

## 2020-08-09 ENCOUNTER — Ambulatory Visit (INDEPENDENT_AMBULATORY_CARE_PROVIDER_SITE_OTHER): Payer: 59 | Admitting: Professional

## 2020-08-09 DIAGNOSIS — F4323 Adjustment disorder with mixed anxiety and depressed mood: Secondary | ICD-10-CM

## 2020-08-11 ENCOUNTER — Telehealth (INDEPENDENT_AMBULATORY_CARE_PROVIDER_SITE_OTHER): Payer: 59 | Admitting: Obstetrics and Gynecology

## 2020-08-11 ENCOUNTER — Encounter: Payer: Self-pay | Admitting: Obstetrics and Gynecology

## 2020-08-11 ENCOUNTER — Other Ambulatory Visit: Payer: Self-pay

## 2020-08-11 DIAGNOSIS — N939 Abnormal uterine and vaginal bleeding, unspecified: Secondary | ICD-10-CM | POA: Diagnosis not present

## 2020-08-11 MED ORDER — TRANEXAMIC ACID 650 MG PO TABS: 1300.0000 mg | ORAL_TABLET | Freq: Three times a day (TID) | ORAL | 6 refills | Status: DC

## 2020-08-11 NOTE — Progress Notes (Signed)
Pt states bleeding has not improved and that it has gotten worse

## 2020-08-11 NOTE — Progress Notes (Signed)
    GYNECOLOGY VIRTUAL VISIT ENCOUNTER NOTE  Provider location: [facility] [place]   Patient location: Home  I connected with April Wilkerson on 08/11/20 at  8:45 AM EDT by MyChart Video Encounter and verified that I am speaking with the correct person using two identifiers.   I discussed the limitations, risks, security and privacy concerns of performing an evaluation and management service virtually and the availability of in person appointments. I also discussed with the patient that there may be a patient responsible charge related to this service. The patient expressed understanding and agreed to proceed.   History:  April Wilkerson is a 25 y.o. P0 presenting to follow up on menorrhagia with regular cycle. Patient reports persistent monthly heavy 5-7 day menses. She describes her flow as heavy with passage of clots. Her menses seems to start again for a few days and she describes that flow as heavier than spotting but less than her heavy period. Patient reports no real improvement with naproxen. Her bleeding pattern remained unchanged. She states that she feels that her flow may have gotten heavier as well. Patient remains hesitant in using hormonal contraception.       Past Medical History:  Diagnosis Date  . Anemia   . Asthma, chronic 10/13/2013  . Palpitations   . Vitamin D deficiency    History reviewed. No pertinent surgical history. The following portions of the patient's history were reviewed and updated as appropriate: allergies, current medications, past family history, past medical history, past social history, past surgical history and problem list.   Health Maintenance:  Normal pap and negative HRHPV on 10/2017.   Review of Systems:  Pertinent items noted in HPI and remainder of comprehensive ROS otherwise negative.  Physical Exam:   General:  Alert, oriented and cooperative. Patient appears to be in no acute distress.  Mental  Status: Normal mood and affect. Normal behavior. Normal judgment and thought content.   Respiratory: Normal respiratory effort, no problems with respiration noted  Rest of physical exam deferred due to type of encounter  Labs and Imaging No results found for this or any previous visit (from the past 336 hour(s)). No results found.     Assessment and Plan:     1. Abnormal uterine bleeding (AUB) Discussed trial of Lysteda. Patient is agreeable Rx provided and patient advised to commit to a 3 month trial       I discussed the assessment and treatment plan with the patient. The patient was provided an opportunity to ask questions and all were answered. The patient agreed with the plan and demonstrated an understanding of the instructions.   The patient was advised to call back or seek an in-person evaluation/go to the ED if the symptoms worsen or if the condition fails to improve as anticipated.  I provided 15 minutes of face-to-face time during this encounter.   Catalina Antigua, MD [facility] [employer]

## 2020-08-16 ENCOUNTER — Ambulatory Visit (INDEPENDENT_AMBULATORY_CARE_PROVIDER_SITE_OTHER): Payer: 59 | Admitting: Professional

## 2020-08-16 DIAGNOSIS — F4323 Adjustment disorder with mixed anxiety and depressed mood: Secondary | ICD-10-CM

## 2020-08-23 ENCOUNTER — Ambulatory Visit (INDEPENDENT_AMBULATORY_CARE_PROVIDER_SITE_OTHER): Payer: 59 | Admitting: Professional

## 2020-08-23 DIAGNOSIS — F4323 Adjustment disorder with mixed anxiety and depressed mood: Secondary | ICD-10-CM

## 2020-08-30 ENCOUNTER — Ambulatory Visit: Payer: 59 | Admitting: Professional

## 2020-09-06 ENCOUNTER — Ambulatory Visit (INDEPENDENT_AMBULATORY_CARE_PROVIDER_SITE_OTHER): Payer: 59 | Admitting: Professional

## 2020-09-06 DIAGNOSIS — F4323 Adjustment disorder with mixed anxiety and depressed mood: Secondary | ICD-10-CM

## 2020-09-13 ENCOUNTER — Ambulatory Visit (INDEPENDENT_AMBULATORY_CARE_PROVIDER_SITE_OTHER): Payer: 59 | Admitting: Professional

## 2020-09-13 DIAGNOSIS — F4323 Adjustment disorder with mixed anxiety and depressed mood: Secondary | ICD-10-CM | POA: Diagnosis not present

## 2020-10-04 ENCOUNTER — Ambulatory Visit (INDEPENDENT_AMBULATORY_CARE_PROVIDER_SITE_OTHER): Payer: 59 | Admitting: Professional

## 2020-10-04 DIAGNOSIS — F4323 Adjustment disorder with mixed anxiety and depressed mood: Secondary | ICD-10-CM | POA: Diagnosis not present

## 2020-10-11 ENCOUNTER — Ambulatory Visit (INDEPENDENT_AMBULATORY_CARE_PROVIDER_SITE_OTHER): Payer: 59 | Admitting: Professional

## 2020-10-11 DIAGNOSIS — F4323 Adjustment disorder with mixed anxiety and depressed mood: Secondary | ICD-10-CM

## 2020-10-18 ENCOUNTER — Ambulatory Visit (INDEPENDENT_AMBULATORY_CARE_PROVIDER_SITE_OTHER): Payer: 59 | Admitting: Professional

## 2020-10-18 DIAGNOSIS — F4323 Adjustment disorder with mixed anxiety and depressed mood: Secondary | ICD-10-CM

## 2020-10-25 ENCOUNTER — Ambulatory Visit: Payer: 59 | Admitting: Osteopathic Medicine

## 2020-11-01 ENCOUNTER — Ambulatory Visit (INDEPENDENT_AMBULATORY_CARE_PROVIDER_SITE_OTHER): Payer: 59 | Admitting: Professional

## 2020-11-01 DIAGNOSIS — F4323 Adjustment disorder with mixed anxiety and depressed mood: Secondary | ICD-10-CM | POA: Diagnosis not present

## 2020-11-07 ENCOUNTER — Other Ambulatory Visit (HOSPITAL_COMMUNITY)
Admission: RE | Admit: 2020-11-07 | Discharge: 2020-11-07 | Disposition: A | Discharge status: Home / Self Care | Payer: 59 | Source: Ambulatory Visit | Attending: Osteopathic Medicine | Admitting: Osteopathic Medicine

## 2020-11-07 ENCOUNTER — Encounter: Payer: Self-pay | Admitting: Osteopathic Medicine

## 2020-11-07 ENCOUNTER — Ambulatory Visit: Payer: 59 | Admitting: Osteopathic Medicine

## 2020-11-07 ENCOUNTER — Other Ambulatory Visit: Payer: Self-pay

## 2020-11-07 VITALS — BP 142/76 | HR 89 | Wt 194.0 lb

## 2020-11-07 DIAGNOSIS — F411 Generalized anxiety disorder: Secondary | ICD-10-CM | POA: Diagnosis not present

## 2020-11-07 DIAGNOSIS — Z124 Encounter for screening for malignant neoplasm of cervix: Secondary | ICD-10-CM

## 2020-11-07 MED ORDER — ESCITALOPRAM OXALATE 5 MG PO TABS: 5.0000 mg | ORAL_TABLET | Freq: Every day | ORAL | 0 refills | Status: DC

## 2020-11-07 NOTE — Progress Notes (Signed)
April Wilkerson is a 25 y.o. female who presents to  [facility]  today, 11/07/20, seeking care for the following:  Pap Anxiety - seeing therapist x4 months, her therapist advised discuss medications w/ PCP, see below     [place] with other pertinent findings:  The primary encounter diagnosis was Anxiety state. A diagnosis of Cervical cancer screening was also pertinent to this visit.   1. Anxiety state Discussed options for tx SSRI and/or prn benzos. Pt opts for low dose SSRI and was advised we should plan to trial this for a few months or can stop/switch if any problems,. Discussed side effects, risks / benefits   2. Cervical cancer screening Pap specimen collected today    There are no Patient Instructions on file for this visit.  No orders of the defined types were placed in this encounter.   Meds ordered this encounter  Medications   escitalopram (LEXAPRO) 5 MG tablet    Sig: Take 1 tablet (5 mg total) by mouth daily.    Dispense:  90 tablet    Refill:  0     See below for relevant physical exam findings  See below for recent lab and imaging results reviewed  Medications, allergies, PMH, PSH, SocH, [place] reviewed below    Follow-up instructions: Return in about 1 month (around 12/08/2020) for VIRTUAL *OR* IN-OFFICE VISIT - [place] (LEXAPRO) .                                        Exam:  BP (!) 142/76 (BP Location: Left Arm, Patient Position: Sitting, Cuff Size: Normal)   Pulse 89   Wt 194 lb (88 kg)   BMI 32.79 kg/m  Constitutional: VS see above. General Appearance: alert, well-developed, well-nourished, NAD Neck: No masses, trachea midline.  Respiratory: Normal respiratory effort.  Abdominal: non-tender, non-distended Neurological: Normal balance/coordination. No tremor. Skin: warm, dry, intact.  Psychiatric: Normal  judgment/insight. Normal mood and affect. Oriented x3.  GYN: No lesions/ulcers to external genitalia, normal urethra, normal vaginal mucosa, physiologic discharge, cervix normal without lesions, uterus not enlarged or tender and no masses in adnexa palpated    Current Meds  Medication Sig   escitalopram (LEXAPRO) 5 MG tablet Take 1 tablet (5 mg total) by mouth daily.   tranexamic acid (LYSTEDA) 650 MG TABS tablet Take 2 tablets (1,300 mg total) by mouth 3 (three) times daily. Take during menses for a maximum of five days    Not on File  Patient Active Problem List   Diagnosis Date Noted   Constipation 10/28/2014   Anemia 10/15/2014   Hypokalemia 10/15/2014   Family history of breast cancer 10/14/2014   Acne 02/15/2014   Chronic asthma 10/13/2013   Faintness 10/13/2013    Family History  Problem Relation Age of Onset   Cancer Mother        breast cancer, not sure what age diagnosed    Hypertension Father     Social History   Tobacco Use  Smoking Status Never  Smokeless Tobacco Never    No past surgical history on file.  Immunization History  Administered Date(s) Administered   DTaP 10/02/1995, 12/10/1995, 02/04/1996, 02/23/1997   HPV Quadrivalent 04/03/2010, 11/06/2011, 07/15/2012   Hepatitis A 12/01/2007, 04/03/2010   Hepatitis B May 31, 1995, 10/02/1995, 06/12/1996   HiB (PRP-OMP)  10/02/1995, 12/10/1995, 02/04/1996, 02/23/1997   IPV 10/02/1995, 12/10/1995, 09/14/1996, 01/13/2013   Influenza,inj,Quad PF,6+ Mos 06/30/2015, 05/13/2018   Influenza-Unspecified 03/02/2000, 01/22/2008, 01/23/2008, 04/09/2011, 01/13/2013, 01/17/2017, 01/01/2019   MMR 09/14/1996, 01/13/2013   Meningococcal Conjugate 12/01/2007   Meningococcal Polysaccharide 11/06/2011   OPV 09/14/1996   PFIZER(Purple Top)SARS-COV-2 Vaccination 07/29/2019, 08/19/2019, 03/16/2020   PPD Test 10/12/2014, 07/04/2015, 07/13/2015   Tdap 10/24/2006, 05/13/2018   Varicella 02/23/1997, 12/01/2007    No results  found for this or any previous visit (from the past 2160 hour(s)).  No results found.     All questions at time of visit were answered - patient instructed to contact office with any additional concerns or updates. ER/RTC precautions were reviewed with the patient as applicable.   Please note: manual typing as well as voice recognition software may have been used to produce this document - typos may escape review. Please contact Dr. Sheppard Coil for any needed clarifications.

## 2020-11-11 LAB — CYTOLOGY - PAP: Diagnosis: NEGATIVE

## 2020-11-15 ENCOUNTER — Ambulatory Visit (INDEPENDENT_AMBULATORY_CARE_PROVIDER_SITE_OTHER): Payer: 59 | Admitting: Professional

## 2020-11-15 DIAGNOSIS — F4323 Adjustment disorder with mixed anxiety and depressed mood: Secondary | ICD-10-CM

## 2020-11-29 ENCOUNTER — Ambulatory Visit (INDEPENDENT_AMBULATORY_CARE_PROVIDER_SITE_OTHER): Payer: 59 | Admitting: Professional

## 2020-11-29 DIAGNOSIS — F4323 Adjustment disorder with mixed anxiety and depressed mood: Secondary | ICD-10-CM

## 2020-12-08 ENCOUNTER — Telehealth: Payer: 59 | Admitting: Osteopathic Medicine

## 2020-12-08 ENCOUNTER — Ambulatory Visit (INDEPENDENT_AMBULATORY_CARE_PROVIDER_SITE_OTHER): Payer: 59 | Admitting: Psychology

## 2020-12-08 DIAGNOSIS — F4323 Adjustment disorder with mixed anxiety and depressed mood: Secondary | ICD-10-CM | POA: Diagnosis not present

## 2020-12-12 ENCOUNTER — Telehealth (INDEPENDENT_AMBULATORY_CARE_PROVIDER_SITE_OTHER): Payer: 59 | Admitting: Family Medicine

## 2020-12-12 ENCOUNTER — Encounter: Payer: Self-pay | Admitting: Family Medicine

## 2020-12-12 DIAGNOSIS — F411 Generalized anxiety disorder: Secondary | ICD-10-CM

## 2020-12-12 MED ORDER — ESCITALOPRAM OXALATE 10 MG PO TABS: 10.0000 mg | ORAL_TABLET | Freq: Every day | ORAL | 0 refills | Status: DC

## 2020-12-12 NOTE — Progress Notes (Signed)
April Wilkerson - 25 y.o. female MRN [id number]  Date of birth: 17-Feb-1996   This visit type was conducted due to national recommendations for restrictions regarding the COVID-19 Pandemic (e.g. social distancing).  This format is felt to be most appropriate for this patient at this time.  All issues noted in this document were discussed and addressed.  No physical exam was performed (except for noted visual exam findings with Video Visits).  I discussed the limitations of evaluation and management by telemedicine and the availability of in person appointments. The patient expressed understanding and agreed to proceed.  I connected withNAME@ on 12/12/20 at  8:30 AM EDT by a video enabled telemedicine application and verified that I am speaking with the correct person using two identifiers.  Present at visit: April Coombe, DO April Wilkerson   Patient Location: Home [address] [address] [place] [address]   Provider location:   [facility]  Chief Complaint  Patient presents with   Medication Refill    HPI  April Wilkerson is a 25 y.o. female who presents via audio/video conferencing for a telehealth visit today.  She is following up today on recent start of lexapro for anxiety.  She ws started on 5mg  daily at last visit approx. 6 weeks ago.  She has done pretty well with this so far.  She has noticed some improvement since starting this but not a whole lot.  She is tolerating well.  She has had a mild decrease in her sex drive.   ROS:  A comprehensive ROS was completed and negative except as noted per HPI  Past Medical History:  Diagnosis Date   Anemia    Asthma, chronic 10/13/2013   Palpitations    Vitamin D deficiency     No past surgical history on file.  Family History  Problem Relation Age of Onset   Cancer Mother        breast cancer, not sure what age diagnosed    Hypertension Father     Social History   Socioeconomic History   Marital status: Single     Spouse name: Not on file   Number of children: Not on file   Years of education: Not on file   Highest education level: Not on file  Occupational History   Occupation: student    Comment: studying 10/15/2013 at [employer]   Tobacco Use   Smoking status: Never   Smokeless tobacco: Never  Vaping Use   Vaping Use: Never used  Substance and Sexual Activity   Alcohol use: No   Drug use: Never   Sexual activity: Yes    Birth control/protection: None    Comment: none needed at the moment   Other Topics Concern   Not on file  Social History Narrative   ** Merged History Encounter **       Social Determinants of Health   Financial Resource Strain: Not on file  Food Insecurity: Not on file  Transportation Needs: Not on file  Physical Activity: Not on file  Stress: Not on file  Social Connections: Not on file  Intimate Partner Violence: Not on file     Current Outpatient Medications:    MULTIPLE VITAMIN PO, Take 1 tablet by mouth daily., Disp: , Rfl:    tranexamic acid (LYSTEDA) 650 MG TABS tablet, Take 2 tablets (1,300 mg total) by mouth 3 (three) times daily. Take during menses for a maximum of five days, Disp: 30 tablet, Rfl: 6  albuterol (VENTOLIN HFA) 108 (90 Base) MCG/ACT inhaler, Inhale 1-2 puffs into the lungs every 4 (four) hours as needed for wheezing or shortness of breath. (Patient not taking: Reported on 12/12/2020), Disp: 18 g, Rfl: 1   aspirin-acetaminophen-caffeine (EXCEDRIN MIGRAINE) 250-250-65 MG tablet, Take 1-2 tablets by mouth every 6 (six) hours as needed for headache. (Patient not taking: No sig reported), Disp: 30 tablet, Rfl: 0   escitalopram (LEXAPRO) 10 MG tablet, Take 1 tablet (10 mg total) by mouth daily., Disp: 90 tablet, Rfl: 0  EXAM:  VITALS per patient if applicable: Ht 5\' 4"  (1.626 m)   Wt 185 lb (83.9 kg)   LMP 11/21/2020 (Within Days)   BMI 31.76 kg/m   GENERAL: alert, oriented, appears well and in no acute distress  HEENT:  atraumatic, conjunttiva clear, no obvious abnormalities on inspection of external nose and ears  NECK: normal movements of the head and neck  LUNGS: on inspection no signs of respiratory distress, breathing rate appears normal, no obvious gross SOB, gasping or wheezing  CV: no obvious cyanosis  MS: moves all visible extremities without noticeable abnormality  PSYCH/NEURO: pleasant and cooperative, no obvious depression or anxiety, speech and thought processing grossly intact  ASSESSMENT AND PLAN:  Discussed the following assessment and plan:  Anxiety state Overall she is tolerating lexapro well.  She has only noticed marginal improvement at current strength.  We discussed trying increased dose at 10mg  daily which she would like to try. Updated rx sent in.  F/u in 6 weeks.      I discussed the assessment and treatment plan with the patient. The patient was provided an opportunity to ask questions and all were answered. The patient agreed with the plan and demonstrated an understanding of the instructions.   The patient was advised to call back or seek an in-person evaluation if the symptoms worsen or if the condition fails to improve as anticipated.    01/21/2021, DO

## 2020-12-12 NOTE — Assessment & Plan Note (Signed)
Overall she is tolerating lexapro well.  She has only noticed marginal improvement at current strength.  We discussed trying increased dose at 10mg  daily which she would like to try. Updated rx sent in.  F/u in 6 weeks.

## 2020-12-13 ENCOUNTER — Ambulatory Visit: Payer: 59 | Admitting: Professional

## 2020-12-27 ENCOUNTER — Ambulatory Visit: Payer: 59 | Admitting: Professional

## 2021-01-10 ENCOUNTER — Ambulatory Visit (INDEPENDENT_AMBULATORY_CARE_PROVIDER_SITE_OTHER): Payer: 59 | Admitting: Professional

## 2021-01-10 DIAGNOSIS — F4323 Adjustment disorder with mixed anxiety and depressed mood: Secondary | ICD-10-CM

## 2021-01-24 ENCOUNTER — Ambulatory Visit: Payer: 59 | Admitting: Professional

## 2021-01-24 ENCOUNTER — Telehealth (INDEPENDENT_AMBULATORY_CARE_PROVIDER_SITE_OTHER): Payer: 59 | Admitting: Family Medicine

## 2021-01-24 ENCOUNTER — Encounter: Payer: Self-pay | Admitting: Family Medicine

## 2021-01-24 DIAGNOSIS — F411 Generalized anxiety disorder: Secondary | ICD-10-CM | POA: Diagnosis not present

## 2021-01-24 MED ORDER — ESCITALOPRAM OXALATE 10 MG PO TABS: 10.0000 mg | ORAL_TABLET | Freq: Every day | ORAL | 1 refills | Status: DC

## 2021-01-24 NOTE — Assessment & Plan Note (Signed)
She continues to do well with current dose of Lexapro.  We will continue at current strength.  We will plan to follow-up in approximately 3 to 4 months.

## 2021-01-24 NOTE — Progress Notes (Signed)
April Wilkerson - 25 y.o. female MRN [id number]  Date of birth: 02/04/1996   This visit type was conducted due to national recommendations for restrictions regarding the COVID-19 Pandemic (e.g. social distancing).  This format is felt to be most appropriate for this patient at this time.  All issues noted in this document were discussed and addressed.  No physical exam was performed (except for noted visual exam findings with Video Visits).  I discussed the limitations of evaluation and management by telemedicine and the availability of in person appointments. The patient expressed understanding and agreed to proceed.  I connected withNAME@ on 01/24/21 at  9:10 AM EST by a video enabled telemedicine application and verified that I am speaking with the correct person using two identifiers.  Present at visit: [name], DO Valeda Charna Busman   Patient Location: Home [address] [address] [place] [address]   Provider location:   [facility]  Chief Complaint  Patient presents with   Medication Problem    HPI  April Wilkerson is a 25 y.o. female who presents via audio/video conferencing for a telehealth visit today.  She is following up today for anxiety.  She reports that she is doing well with Lexapro.  She has not noted any significant side effects related to this.  She would like to continue at current strength.     ROS:  A comprehensive ROS was completed and negative except as noted per HPI  Past Medical History:  Diagnosis Date   Anemia    Asthma, chronic 10/13/2013   Palpitations    Vitamin D deficiency     History reviewed. No pertinent surgical history.  Family History  Problem Relation Age of Onset   Cancer Mother        breast cancer, not sure what age diagnosed    Hypertension Father     Social History   Socioeconomic History   Marital status: Single    Spouse name: Not on file   Number of children: Not on file   Years of education: Not on file    Highest education level: Not on file  Occupational History   Occupation: student    Comment: studying Development worker, community at [employer]   Tobacco Use   Smoking status: Never   Smokeless tobacco: Never  Vaping Use   Vaping Use: Never used  Substance and Sexual Activity   Alcohol use: No   Drug use: Never   Sexual activity: Yes    Birth control/protection: None    Comment: none needed at the moment   Other Topics Concern   Not on file  Social History Narrative   ** Merged History Encounter **       Social Determinants of Health   Financial Resource Strain: Not on file  Food Insecurity: Not on file  Transportation Needs: Not on file  Physical Activity: Not on file  Stress: Not on file  Social Connections: Not on file  Intimate Partner Violence: Not on file     Current Outpatient Medications:    albuterol (VENTOLIN HFA) 108 (90 Base) MCG/ACT inhaler, Inhale 1-2 puffs into the lungs every 4 (four) hours as needed for wheezing or shortness of breath., Disp: 18 g, Rfl: 1   aspirin-acetaminophen-caffeine (EXCEDRIN MIGRAINE) 250-250-65 MG tablet, Take 1-2 tablets by mouth every 6 (six) hours as needed for headache., Disp: 30 tablet, Rfl: 0   MULTIPLE VITAMIN PO, Take 1 tablet by mouth daily., Disp: , Rfl:    tranexamic  acid (LYSTEDA) 650 MG TABS tablet, Take 2 tablets (1,300 mg total) by mouth 3 (three) times daily. Take during menses for a maximum of five days, Disp: 30 tablet, Rfl: 6   escitalopram (LEXAPRO) 10 MG tablet, Take 1 tablet (10 mg total) by mouth daily., Disp: 90 tablet, Rfl: 1  EXAM:  VITALS per patient if applicable: Ht 5\' 4"  (1.626 m)   Wt 185 lb (83.9 kg)   BMI 31.76 kg/m   GENERAL: alert, oriented, appears well and in no acute distress  HEENT: atraumatic, conjunttiva clear, no obvious abnormalities on inspection of external nose and ears  NECK: normal movements of the head and neck  LUNGS: on inspection no signs of respiratory distress, breathing rate  appears normal, no obvious gross SOB, gasping or wheezing  CV: no obvious cyanosis  MS: moves all visible extremities without noticeable abnormality  PSYCH/NEURO: pleasant and cooperative, no obvious depression or anxiety, speech and thought processing grossly intact  ASSESSMENT AND PLAN:  Discussed the following assessment and plan:  Anxiety state She continues to do well with current dose of Lexapro.  We will continue at current strength.  We will plan to follow-up in approximately 3 to 4 months.     I discussed the assessment and treatment plan with the patient. The patient was provided an opportunity to ask questions and all were answered. The patient agreed with the plan and demonstrated an understanding of the instructions.   The patient was advised to call back or seek an in-person evaluation if the symptoms worsen or if the condition fails to improve as anticipated.    , DO

## 2021-02-07 ENCOUNTER — Ambulatory Visit: Payer: 59 | Admitting: Professional

## 2021-02-21 ENCOUNTER — Ambulatory Visit: Payer: 59 | Admitting: Professional

## 2021-03-07 ENCOUNTER — Ambulatory Visit: Payer: 59 | Admitting: Professional

## 2021-03-21 ENCOUNTER — Ambulatory Visit: Payer: 59 | Admitting: Professional

## 2021-04-04 ENCOUNTER — Ambulatory Visit: Payer: 59 | Admitting: Professional

## 2021-04-24 ENCOUNTER — Other Ambulatory Visit: Payer: Self-pay

## 2021-04-24 ENCOUNTER — Ambulatory Visit: Payer: 59 | Admitting: Physician Assistant

## 2021-04-24 VITALS — BP 148/84 | HR 90 | Ht 64.0 in | Wt 202.0 lb

## 2021-04-24 DIAGNOSIS — R519 Headache, unspecified: Secondary | ICD-10-CM | POA: Diagnosis not present

## 2021-04-24 DIAGNOSIS — H6983 Other specified disorders of Eustachian tube, bilateral: Secondary | ICD-10-CM

## 2021-04-24 DIAGNOSIS — R29898 Other symptoms and signs involving the musculoskeletal system: Secondary | ICD-10-CM | POA: Diagnosis not present

## 2021-04-24 DIAGNOSIS — R42 Dizziness and giddiness: Secondary | ICD-10-CM

## 2021-04-24 MED ORDER — FLUTICASONE PROPIONATE 50 MCG/ACT NA SUSP
2.0000 | Freq: Every day | NASAL | 0 refills | Status: DC
Start: 2021-04-24 — End: 2022-02-13

## 2021-04-24 MED ORDER — AMOXICILLIN 875 MG PO TABS: 875.0000 mg | ORAL_TABLET | Freq: Two times a day (BID) | ORAL | 0 refills | Status: DC

## 2021-04-24 NOTE — Progress Notes (Signed)
Subjective:    Patient ID: April Wilkerson, female    DOB: [birth year], 26 y.o.   MRN: [id number]  HPI Pt is a 26 yo female who presents to the clinic with daily headaches for the last week and half. She had an episode of vertigo about 3 days ago as well where the "room was spinning" and she had to lay down. She does not have hx of migraines or headaches. She describes the headaches frontal and wrap around to the back of her head. She is getting massages and seem to help some. She does have some ear pressure and popping. She denies any ST, cough, SOB, fever, body aches or chills. She does get a little nauseated with HA but no vomiting. Headache waxes and wanes throughout the day. She denies any recent neck injury. She is taking OTC ibuprofen/tyelnol which helps some.    .. Active Ambulatory Problems    Diagnosis Date Noted   Chronic asthma 10/13/2013   Faintness 10/13/2013   Acne 02/15/2014   Family history of breast cancer 10/14/2014   Anemia 10/15/2014   Hypokalemia 10/15/2014   Constipation 10/28/2014   Anxiety state 12/12/2020   Frequent headaches 04/26/2021   Vertigo 04/26/2021   Neck tightness 04/26/2021   ETD (Eustachian tube dysfunction), bilateral 04/26/2021   Resolved Ambulatory Problems    Diagnosis Date Noted   No Resolved Ambulatory Problems   Past Medical History:  Diagnosis Date   Asthma, chronic 10/13/2013   Palpitations    Vitamin D deficiency       Review of Systems See HPI.     Objective:   Physical Exam Vitals reviewed.  Constitutional:      Appearance: She is well-developed.  HENT:     Head: Normocephalic.     Comments: TMs will mild effusion bilateral.     Mouth/Throat:     Mouth: Mucous membranes are moist.  Eyes:     General: No scleral icterus.    Extraocular Movements: Extraocular movements intact.     Pupils: Pupils are equal, round, and reactive to light. Pupils are equal.  Neck:     Comments: No cervical spine tenderness.   Tightness of the paracervical muscles into occipital area.  Cardiovascular:     Rate and Rhythm: Normal rate and regular rhythm.     Heart sounds: Normal heart sounds.  Pulmonary:     Effort: Pulmonary effort is normal.     Breath sounds: Normal breath sounds.  Musculoskeletal:     Cervical back: Normal range of motion and neck supple.  Lymphadenopathy:     Cervical: No cervical adenopathy.  Neurological:     Mental Status: She is alert and oriented to person, place, and time.     Cranial Nerves: No cranial nerve deficit or facial asymmetry.     Coordination: Romberg sign negative. Coordination normal.     Gait: Gait normal.  Psychiatric:        Mood and Affect: Mood normal.          Assessment & Plan:  [name] KitchenMarland KitchenMiracle was seen today for headache.  Diagnoses and all orders for this visit:  Frequent headaches -     fluticasone (FLONASE) 50 MCG/ACT nasal spray; Place 2 sprays into both nostrils daily. -     amoxicillin (AMOXIL) 875 MG tablet; Take 1 tablet (875 mg total) by mouth 2 (two) times daily.  Vertigo  Neck tightness  ETD (Eustachian tube dysfunction), bilateral   Unclear etiology tension headache from  neck tightness vs sinusitis. No red flag symptoms such as neuro findings.  Pt does have some fluid behind ears and episode of vertigo. I am going to treat for sinusitis with amoxicillin and flonase. Continue to get neck massages. Consider icy hot patches, tens units, and neck stretches. Ok to continue to take ibuprofen. Follow up as needed or if new symptoms arise.

## 2021-04-26 DIAGNOSIS — R42 Dizziness and giddiness: Secondary | ICD-10-CM | POA: Insufficient documentation

## 2021-04-26 DIAGNOSIS — R519 Headache, unspecified: Secondary | ICD-10-CM | POA: Insufficient documentation

## 2021-04-26 DIAGNOSIS — R29898 Other symptoms and signs involving the musculoskeletal system: Secondary | ICD-10-CM | POA: Insufficient documentation

## 2021-04-26 DIAGNOSIS — H6983 Other specified disorders of Eustachian tube, bilateral: Secondary | ICD-10-CM | POA: Insufficient documentation

## 2021-05-11 ENCOUNTER — Other Ambulatory Visit: Payer: Self-pay | Admitting: Neurology

## 2021-05-11 MED ORDER — ALBUTEROL SULFATE HFA 108 (90 BASE) MCG/ACT IN AERS: 1.0000 | INHALATION_SPRAY | RESPIRATORY_TRACT | 1 refills | Status: DC | PRN

## 2021-05-11 NOTE — Telephone Encounter (Signed)
Patient left vm asking for April Wilkerson to refill her Albuterol inhaler.  Patient of Dr. Zigmund Daniel. Looks like last prescribed November 2020 by Dr. Sheppard Coil (former patient). Please advise.

## 2021-06-05 IMAGING — US US PELVIS COMPLETE WITH TRANSVAGINAL
1 series · 13 of 25 positions shown · non-contrast
Comparison: None

CLINICAL DATA: Irregular bleeding

EXAM:
TRANSABDOMINAL AND TRANSVAGINAL ULTRASOUND OF PELVIS
TECHNIQUE: Both transabdominal and transvaginal ultrasound examinations of the
pelvis were performed. Transabdominal technique was performed for
global imaging of the pelvis including uterus, ovaries, adnexal
regions, and pelvic cul-de-sac. It was necessary to proceed with
endovaginal exam following the transabdominal exam to visualize the
uterus endometrium ovaries.

[Series 1: us pelvis complete with transvaginal · 0.28mm/px · 13 of 104 slices shown]
[im 1/104]
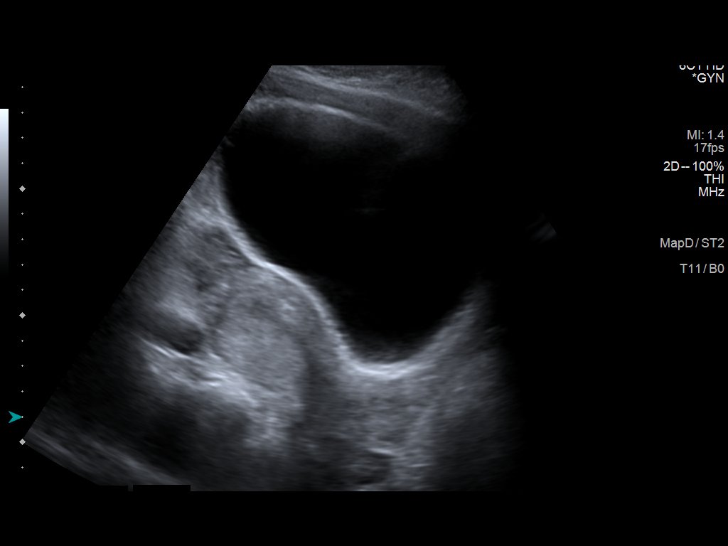
[im 9/104]
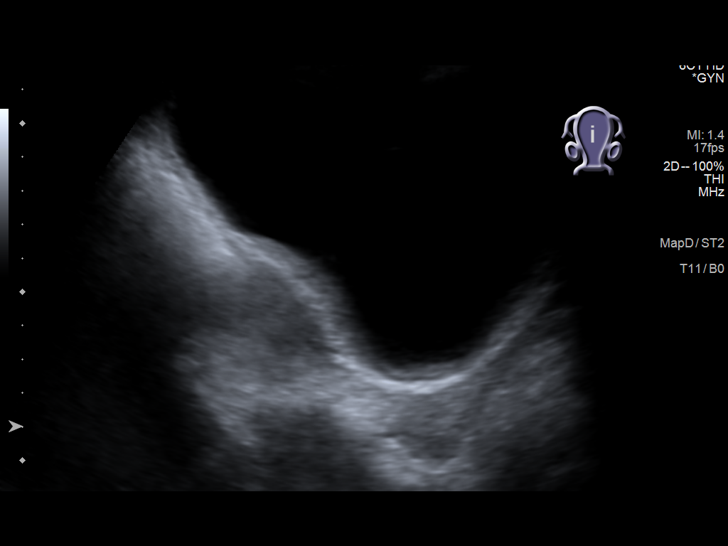
[im 18/104]
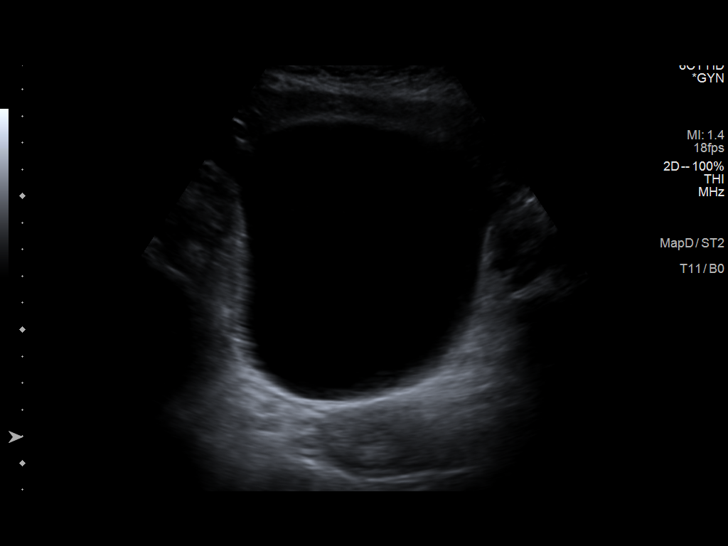
[im 26/104]
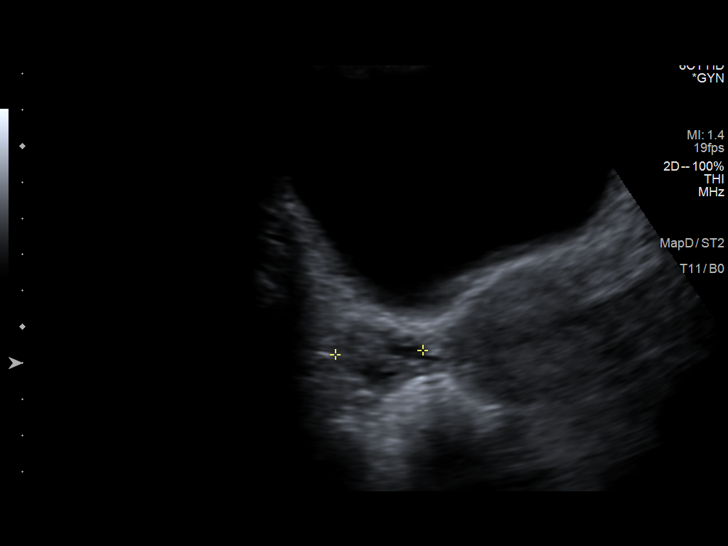
[im 35/104]
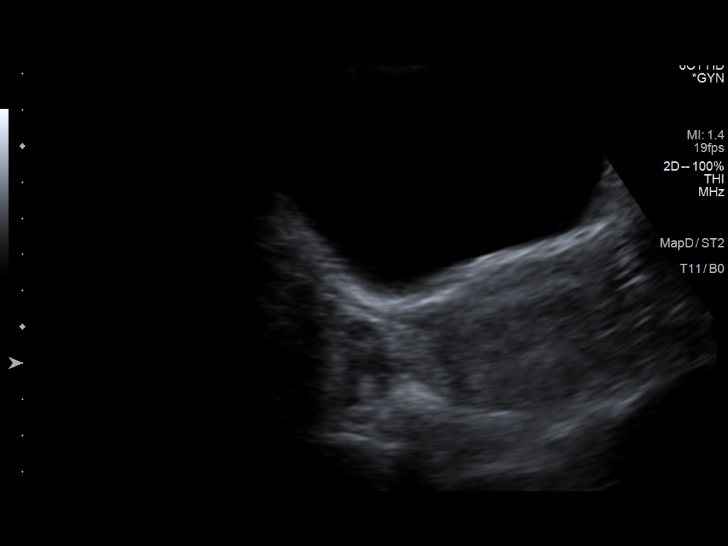
[im 43/104]
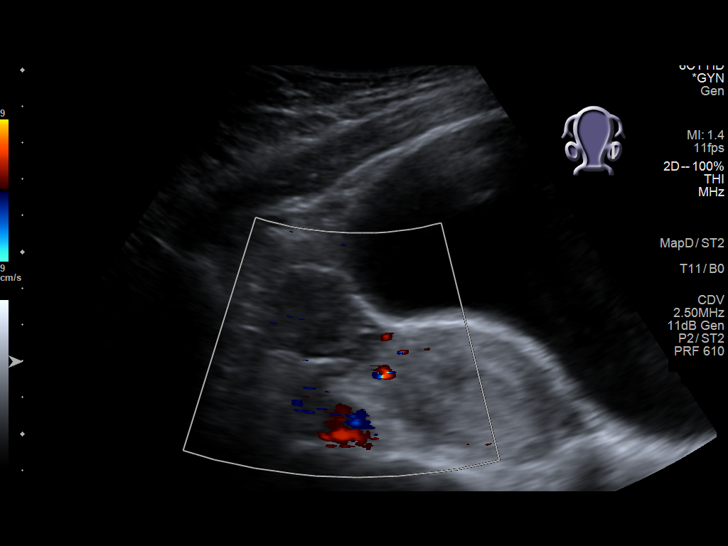
[im 52/104]
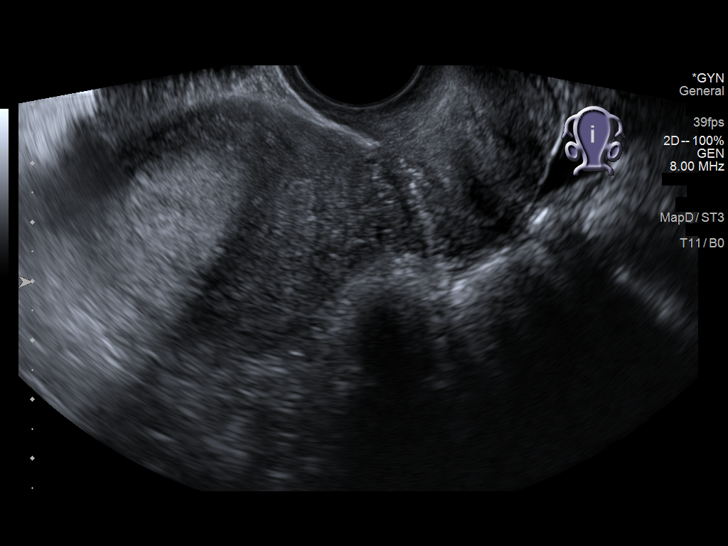
[im 61/104]
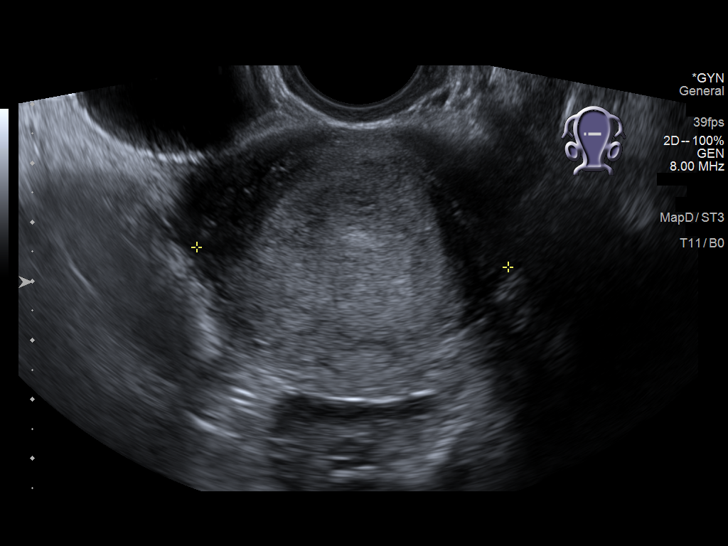
[im 69/104]
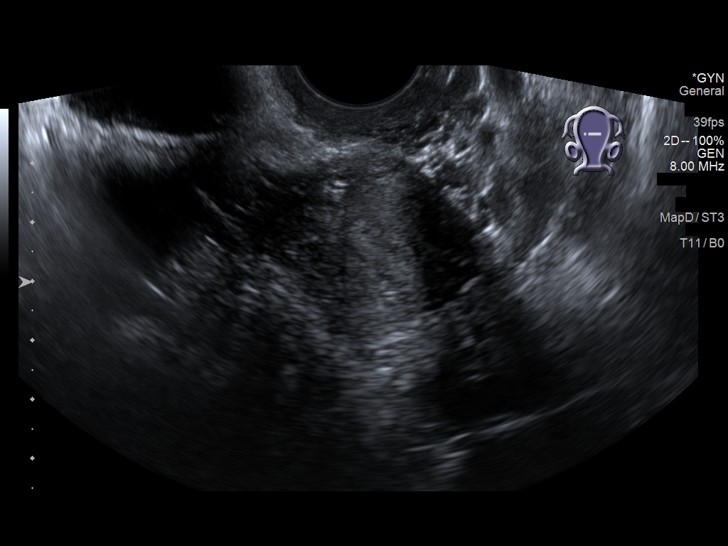
[im 78/104]
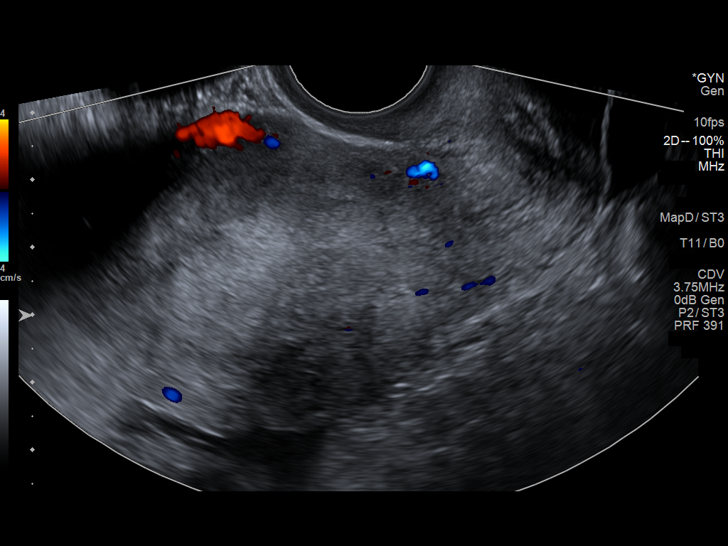
[im 86/104]
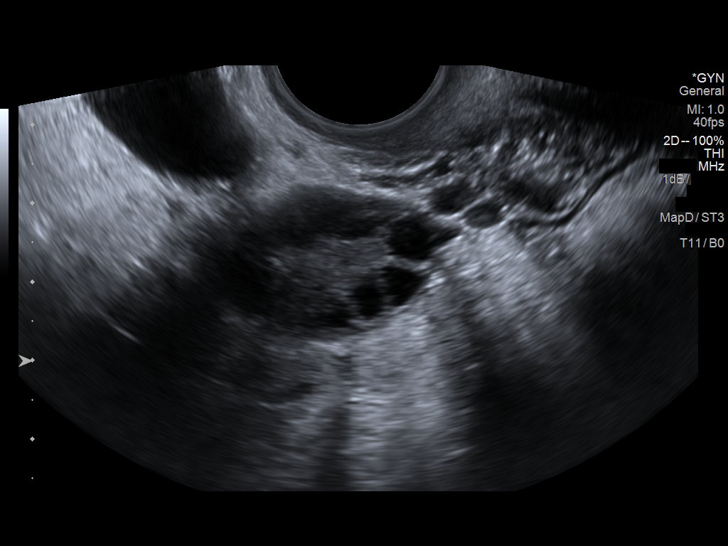
[im 95/104]
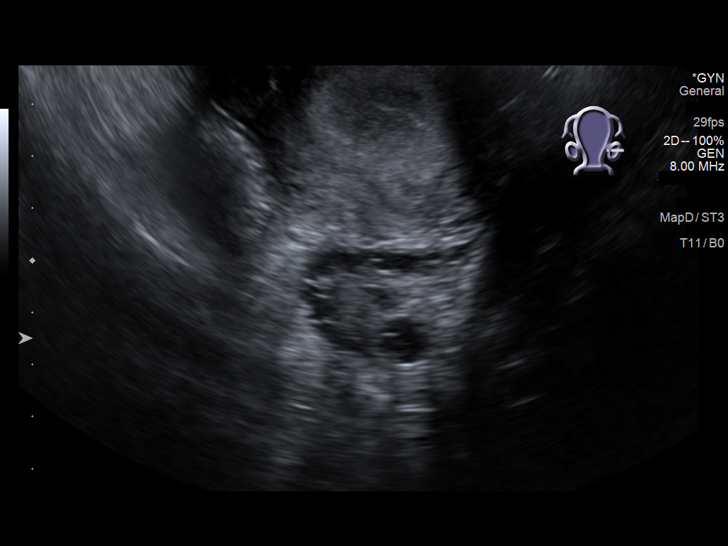
[im 104/104]
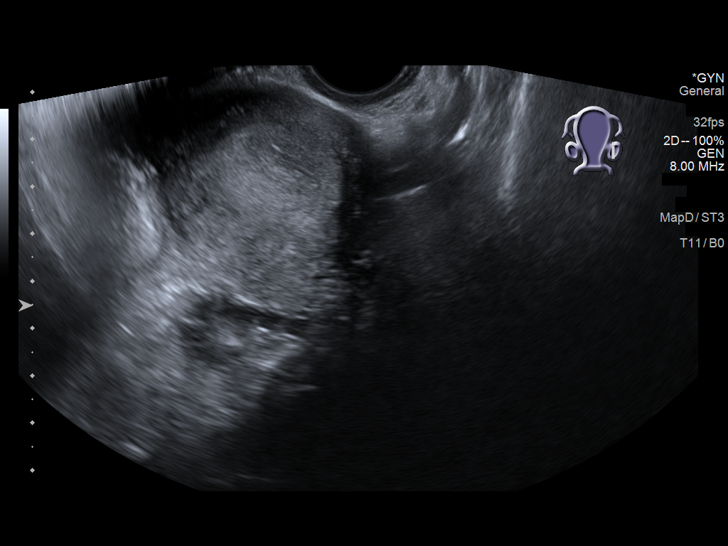

[13 of 25 positions shown; findings below may reference images not displayed]

FINDINGS: Uterus

Measurements: 9.8 x 4.3 x 4.5 cm = volume: 95 mL. No fibroids or
other mass visualized.

Endometrium

Thickness: 24.4 mm. Heterogenous endometrial thickening without
focal mass.

Right ovary

Measurements: 3.3 x 2 x 2.3 cm = volume: 8 mL. Normal appearance/no
adnexal mass.

Left ovary

Measurements: 3.6 x 2.3 x 5 3 cm = volume: 12 mL. Normal
appearance/no adnexal mass.

Other findings

Small free fluid.
IMPRESSION: 1. Endometrial thickness of 24.4 mm. If bleeding remains
unresponsive to hormonal or medical therapy, focal lesion work-up
with sonohysterogram should be considered. Endometrial biopsy should
also be considered in pre-menopausal patients at high risk for
endometrial carcinoma. (Ref: Radiological Reasoning: Algorithmic
Workup of Abnormal Vaginal Bleeding with Endovaginal Sonography and
Sonohysterography. AJR [id number]; 191:S68-73)
2. Small free fluid in the pelvis.

## 2021-06-08 ENCOUNTER — Ambulatory Visit: Payer: 59 | Admitting: Family Medicine

## 2021-06-14 ENCOUNTER — Encounter: Payer: 59 | Admitting: Family Medicine

## 2021-06-14 ENCOUNTER — Encounter: Payer: 59 | Admitting: Osteopathic Medicine

## 2021-07-24 ENCOUNTER — Ambulatory Visit: Payer: 59 | Admitting: Physician Assistant

## 2021-07-24 VITALS — Temp 98.3°F | Ht 64.0 in | Wt 208.0 lb

## 2021-07-24 DIAGNOSIS — R051 Acute cough: Secondary | ICD-10-CM | POA: Diagnosis not present

## 2021-07-24 DIAGNOSIS — R42 Dizziness and giddiness: Secondary | ICD-10-CM | POA: Diagnosis not present

## 2021-07-24 DIAGNOSIS — R519 Headache, unspecified: Secondary | ICD-10-CM

## 2021-07-24 MED ORDER — METHYLPREDNISOLONE 4 MG PO TBPK: ORAL_TABLET | ORAL | 0 refills | Status: DC

## 2021-07-24 MED ORDER — LEVOCETIRIZINE DIHYDROCHLORIDE 5 MG PO TABS: 5.0000 mg | ORAL_TABLET | Freq: Every evening | ORAL | 3 refills | Status: DC

## 2021-07-24 MED ORDER — BENZONATATE 200 MG PO CAPS: 200.0000 mg | ORAL_CAPSULE | Freq: Three times a day (TID) | ORAL | 0 refills | Status: DC | PRN

## 2021-07-24 NOTE — Patient Instructions (Addendum)
Start xyzal at bedtime daily, medrol dose pack, flonase daily ?Will order CT ?Tessalon pearl for cough ?

## 2021-07-24 NOTE — Progress Notes (Signed)
Acute Office Visit  Subjective:     Patient ID: April Wilkerson, female    DOB: [birth year], 26 y.o.   MRN: [id number]  Chief Complaint  Patient presents with   Dizziness    HPI Pt is a 26 yo female who presents to the clinic to follow up on dizziness and headaches. She was seen last in febuary and treated with augmentin and flonase. She has been using the flonase daily. She did get some better for a little bit with dizziness but now it is back. She continues to have frequent worsening headaches. No vision changes. She is coughing and congested. She is not taking allergy medication. No ST. She does have PND. No ear pain.  .. Active Ambulatory Problems    Diagnosis Date Noted   Chronic asthma 10/13/2013   Faintness 10/13/2013   Acne 02/15/2014   Family history of breast cancer 10/14/2014   Anemia 10/15/2014   Hypokalemia 10/15/2014   Constipation 10/28/2014   Anxiety state 12/12/2020   Frequent headaches 04/26/2021   Dizziness 04/26/2021   Neck tightness 04/26/2021   ETD (Eustachian tube dysfunction), bilateral 04/26/2021   Resolved Ambulatory Problems    Diagnosis Date Noted   No Resolved Ambulatory Problems   Past Medical History:  Diagnosis Date   Asthma, chronic 10/13/2013   Palpitations    Vitamin D deficiency      ROS See HPI.      Objective:    Temp 98.3 F (36.8 C) (Oral)   Ht 5\' 4"  (1.626 m)   Wt 208 lb (94.3 kg)   SpO2 100%   BMI 35.70 kg/m  BP Readings from Last 3 Encounters:  04/24/21 (!) 148/84  11/07/20 (!) 142/76  07/27/20 (!) 134/92      Physical Exam Vitals reviewed.  Constitutional:      Appearance: Normal appearance.  HENT:     Head: Normocephalic.     Right Ear: Tympanic membrane, ear canal and external ear normal. There is no impacted cerumen.     Left Ear: Tympanic membrane, ear canal and external ear normal. There is no impacted cerumen.     Nose: Congestion present. No rhinorrhea.     Comments: Bilateral swollen  nasal turbinates    Mouth/Throat:     Mouth: Mucous membranes are moist.     Pharynx: No oropharyngeal exudate or posterior oropharyngeal erythema.  Eyes:     Extraocular Movements: Extraocular movements intact.     Conjunctiva/sclera: Conjunctivae normal.     Pupils: Pupils are equal, round, and reactive to light.  Cardiovascular:     Rate and Rhythm: Normal rate and regular rhythm.  Pulmonary:     Effort: Pulmonary effort is normal.     Breath sounds: Normal breath sounds.  Neurological:     General: No focal deficit present.     Mental Status: She is alert and oriented to person, place, and time.  Psychiatric:        Mood and Affect: Mood normal.         Assessment & Plan:  [name] KitchenMarland KitchenMiracle was seen today for dizziness.  Diagnoses and all orders for this visit:  Dizziness -     methylPREDNISolone (MEDROL DOSEPAK) 4 MG TBPK tablet; Take as directed by package insert. -     levocetirizine (XYZAL) 5 MG tablet; Take 1 tablet (5 mg total) by mouth every evening. -     CT HEAD WO CONTRAST (5MM); Future  Acute cough -     benzonatate (  TESSALON) 200 MG capsule; Take 1 capsule (200 mg total) by mouth 3 (three) times daily as needed.  Frequent headaches   Sent medrol dose pack Continue flonase Start xyzal  I feel like symptoms are more allergic Tessalon for cough Ordered CT due to dizziness and worsening headaches   Iran Planas, PA-C

## 2021-08-03 ENCOUNTER — Encounter: Payer: Self-pay | Admitting: Physician Assistant

## 2021-08-07 ENCOUNTER — Encounter: Payer: Self-pay | Admitting: Physician Assistant

## 2021-08-07 ENCOUNTER — Ambulatory Visit: Payer: 59 | Admitting: Physician Assistant

## 2021-08-07 VITALS — BP 127/81 | HR 88 | Ht 64.0 in | Wt 214.0 lb

## 2021-08-07 DIAGNOSIS — H6983 Other specified disorders of Eustachian tube, bilateral: Secondary | ICD-10-CM

## 2021-08-07 DIAGNOSIS — R051 Acute cough: Secondary | ICD-10-CM

## 2021-08-07 DIAGNOSIS — J309 Allergic rhinitis, unspecified: Secondary | ICD-10-CM

## 2021-08-07 DIAGNOSIS — R42 Dizziness and giddiness: Secondary | ICD-10-CM

## 2021-08-07 DIAGNOSIS — J302 Other seasonal allergic rhinitis: Secondary | ICD-10-CM

## 2021-08-07 MED ORDER — MONTELUKAST SODIUM 10 MG PO TABS: 10.0000 mg | ORAL_TABLET | Freq: Every day | ORAL | 3 refills | Status: DC

## 2021-08-07 NOTE — Patient Instructions (Signed)
Add Singulair Flonase 1 spray each nostril twice a day Continue xyzal

## 2021-08-07 NOTE — Progress Notes (Signed)
   Established Patient Office Visit  Subjective   Patient ID: April Wilkerson, female    DOB: April 28, 1995  Age: 26 y.o. MRN: [id number]  Chief Complaint  Patient presents with  . Follow-up    HPI  {History (Optional):23778}  ROS    Objective:     There were no vitals taken for this visit. {Vitals History (Optional):23777}  Physical Exam   No results found for any visits on 08/07/21.  {Labs (Optional):23779}  The ASCVD Risk score (Arnett DK, et al., 2019) failed to calculate for the following reasons:   The 2019 ASCVD risk score is only valid for ages 22 to 31    Assessment & Plan:   Problem List Items Addressed This Visit       Unprioritized   Dizziness   Other Visit Diagnoses     Acute cough           No follow-ups on file.    Tandy Gaw, PA-C

## 2021-08-08 ENCOUNTER — Encounter: Payer: Self-pay | Admitting: Physician Assistant

## 2021-08-08 DIAGNOSIS — R051 Acute cough: Secondary | ICD-10-CM | POA: Insufficient documentation

## 2021-08-08 DIAGNOSIS — J309 Allergic rhinitis, unspecified: Secondary | ICD-10-CM | POA: Insufficient documentation

## 2021-08-11 ENCOUNTER — Ambulatory Visit: Payer: 59 | Admitting: Professional

## 2021-08-12 ENCOUNTER — Encounter: Payer: Self-pay | Admitting: Professional

## 2021-08-12 ENCOUNTER — Ambulatory Visit (INDEPENDENT_AMBULATORY_CARE_PROVIDER_SITE_OTHER): Payer: 59 | Admitting: Professional

## 2021-08-12 DIAGNOSIS — F4323 Adjustment disorder with mixed anxiety and depressed mood: Secondary | ICD-10-CM | POA: Diagnosis not present

## 2021-08-12 NOTE — Progress Notes (Signed)
° ° ° ° ° ° ° ° ° ° ° ° ° ° °  Tasha Jindra, LCMHC °

## 2021-08-12 NOTE — Progress Notes (Signed)
[facility] Counselor Initial Adult Exam  Name: April Wilkerson Date: 08/12/2021 MRN: [id number] DOB: 1995/12/21 PCP: [name], DO  Time spent: 56 minutes [id number]  Guardian/Payee:  self    Paperwork requested: No   Reason for Visit Loman Chroman Problem: This session was held via video teletherapy. The patient consented to video teletherapy and was located at her home during this session. She is aware it is the responsibility of the patient to secure confidentiality on her end of the session. The provider was in a private home office for the duration of this session.   The patient arrived late for her webex appointment.  The patient reports she has come back into therapy for a number of reasons. She reports she wants to talk about relationship with April Wilkerson, however they have been hitting a rift lately. She reports that Clinician would be extremely proud of her is that she has realized that she does not respect April Wilkerson as a man. His female bff's partner's mom came to talk to her and April Wilkerson. As they were walking out the pt suggested they should say goodbye to her mother-in-law and he said no he was really ready to go. She told him she was going to say goodbye and when she did the lady wanted him to come over. When they left he was angry that she did not listen to him. He shared with her that she has done this before.  Patient has thought and prayed about and realized that he is the first man she has opened up to emotionally. Patient realized that she responds like this to her father and that this is how she has been treating April Wilkerson. Patient has been trying to do better with listening to in and not putting her feeling her thoughts are more important than his. She shared that he was right and asked him to tell her.  The second thing she wants to address is related to career. She feels more hopeful and realizes she has to remind herself that it is a journey and that it is  her journey.   The patient reports the thing things is that "I have a clutter problem".  Currently, she would like to address relationship issues. She is in another wedding, this time for her younger cousin April Wilkerson. The bachelorette party was put on the pt and her cousin [name]. The bride sara wanted to go to a strip club and she shared with April Wilkerson and he was not a fan. Patient felt conflicted because her cousin has few friends and it was only going to be her cousins who are her bridesmaids. Patient shared her conflict with April Wilkerson and he is now giving her the cold shoulder. He will not talk about it but when talking on Thursday evening he told her he did not want to talk to her throughout the next three days because he is so distracted by what is going on.   Mental Status Exam: Appearance:   Neat     Behavior:  Sharing  Motor:  Normal  Speech/Language:   Clear and Coherent and Normal Rate  Affect:  Appropriate  Mood:  normal  Thought process:  goal directed  Thought content:    WNL  Sensory/Perceptual disturbances:    WNL  Orientation:  oriented to person, place, time/date, and situation  Attention:  Good  Concentration:  Good  Memory:  WNL  Fund of knowledge:   Good  Insight:    Good  JudgmentPeri Jefferson  Impulse Control:  Good   Risk Assessment: Danger to Self:  No Self-injurious Behavior: No Danger to Others: No Duty to Warn:no Physical Aggression / Violence:No  Access to Firearms a concern: No  Gang Involvement:No  Patient / guardian was educated about steps to take if suicide or homicide risk level increases between visits: no While future psychiatric events cannot be accurately predicted, the patient does not currently require acute inpatient psychiatric care and does not currently meet [facility] involuntary commitment criteria.  Substance Abuse History: Current substance abuse:  patient reports she is not drinking as often as she used to drink; she might have a small  glass of wine once every few times she goes out, seldom.     Past Psychiatric History:   Previous psychological history is significant for anxiety and depression Outpatient Providers:Kathleen Kinze Labo in 2022 History of Psych Hospitalization: No  Psychological Testing:  none    Abuse History:  Victim of: No., n/a   Report needed: No. Victim of Neglect:No. Perpetrator of n/a  Witness / Exposure to Domestic Violence: No   Protective Services Involvement: No  Witness to [employer] Violence:  No   Family History:  Family History  Problem Relation Age of Onset   Cancer Mother        breast cancer, not sure what age diagnosed    Hypertension Father     Living situation: the patient lives with their family  Sexual Orientation: Bisexual  Relationship Status: in relationship with April Wilkerson for 13 months  Name of spouse / other:n/a If a parent, number of children / ages:n/a  Support Systems: significant other friends parents  Surveyor, quantity Stress:  Yes   Income/Employment/Disability: Employment  Financial planner: No   Educational History: Education: college graduate  Religion/Sprituality/World View: Christian  Any cultural differences that may affect / interfere with treatment:  not applicable   Recreation/Hobbies: reading, spending time with bf and friends, likes to shop,   Stressors: Financial difficulties   Occupational concerns   Other: getting used to career being a process    Strengths: Supportive Relationships, Family, Friends, Warehouse manager, Spirituality, Hopefulness, Journalist, newspaper, and Able to Communicate Effectively  Barriers:  none   Legal History: Pending legal issue / charges: The patient has no significant history of legal issues. History of legal issue / charges: none  Medical History/Surgical History: reviewed Past Medical History:  Diagnosis Date   Anemia    Asthma, chronic 10/13/2013   Palpitations    Vitamin D deficiency     History reviewed. No  pertinent surgical history.  Medications: Current Outpatient Medications  Medication Sig Dispense Refill   albuterol (VENTOLIN HFA) 108 (90 Base) MCG/ACT inhaler Inhale 1-2 puffs into the lungs every 4 (four) hours as needed for wheezing or shortness of breath. 18 g 1   ASHWAGANDHA PO Take by mouth.     aspirin-acetaminophen-caffeine (EXCEDRIN MIGRAINE) 250-250-65 MG tablet Take 1-2 tablets by mouth every 6 (six) hours as needed for headache. 30 tablet 0   escitalopram (LEXAPRO) 10 MG tablet Take 1 tablet (10 mg total) by mouth daily. 90 tablet 1   fluticasone (FLONASE) 50 MCG/ACT nasal spray Place 2 sprays into both nostrils daily. 16 g 0   levocetirizine (XYZAL) 5 MG tablet Take 1 tablet (5 mg total) by mouth every evening. 90 tablet 3   montelukast (SINGULAIR) 10 MG tablet Take 1 tablet (10 mg total) by mouth at bedtime. 90 tablet 3   MULTIPLE VITAMIN PO Take 1 tablet by mouth daily.  OVER THE COUNTER MEDICATION Safron     Probiotic Product (PROBIOTIC-10 PO) Take by mouth.     tranexamic acid (LYSTEDA) 650 MG TABS tablet Take 2 tablets (1,300 mg total) by mouth 3 (three) times daily. Take during menses for a maximum of five days 30 tablet 6   No current facility-administered medications for this visit.    No Known Allergies  Diagnoses:  No diagnosis found.  Plan of Care:  -patient wants to focus on growing her relationship skills, developing her career, and on her issues with clutter. meet again on Tuesday, August 22, 2021 at 8am

## 2021-08-22 ENCOUNTER — Encounter: Payer: Self-pay | Admitting: Professional

## 2021-08-22 ENCOUNTER — Ambulatory Visit (INDEPENDENT_AMBULATORY_CARE_PROVIDER_SITE_OTHER): Payer: 59 | Admitting: Professional

## 2021-08-22 DIAGNOSIS — F4323 Adjustment disorder with mixed anxiety and depressed mood: Secondary | ICD-10-CM
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
## 2021-08-22 NOTE — Progress Notes (Signed)
° ° ° ° ° ° ° ° ° ° ° ° ° ° °  Faith Branan, LCMHC °

## 2021-10-10 ENCOUNTER — Encounter: Payer: Self-pay | Admitting: Family Medicine

## 2021-10-10 ENCOUNTER — Encounter: Payer: 59 | Admitting: Family Medicine

## 2021-10-12 ENCOUNTER — Encounter: Payer: Self-pay | Admitting: Professional

## 2021-10-12 ENCOUNTER — Ambulatory Visit: Payer: 59 | Admitting: Professional

## 2021-10-12 DIAGNOSIS — F4323 Adjustment disorder with mixed anxiety and depressed mood: Secondary | ICD-10-CM
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
## 2021-10-25 ENCOUNTER — Ambulatory Visit (INDEPENDENT_AMBULATORY_CARE_PROVIDER_SITE_OTHER): Payer: 59 | Admitting: Professional

## 2021-10-25 DIAGNOSIS — F4323 Adjustment disorder with mixed anxiety and depressed mood: Secondary | ICD-10-CM | POA: Diagnosis not present

## 2021-10-31 ENCOUNTER — Encounter: Payer: Self-pay | Admitting: Professional
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
## 2021-11-08 ENCOUNTER — Ambulatory Visit (INDEPENDENT_AMBULATORY_CARE_PROVIDER_SITE_OTHER): Payer: 59 | Admitting: Professional

## 2021-11-08 ENCOUNTER — Encounter: Payer: Self-pay | Admitting: Professional

## 2021-11-08 DIAGNOSIS — F331 Major depressive disorder, recurrent, moderate: Secondary | ICD-10-CM | POA: Diagnosis not present
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
## 2021-11-09 ENCOUNTER — Telehealth (INDEPENDENT_AMBULATORY_CARE_PROVIDER_SITE_OTHER): Payer: 59 | Admitting: Family Medicine

## 2021-11-09 ENCOUNTER — Encounter: Payer: Self-pay | Admitting: Family Medicine

## 2021-11-09 DIAGNOSIS — F331 Major depressive disorder, recurrent, moderate: Secondary | ICD-10-CM

## 2021-11-09 MED ORDER — ESCITALOPRAM OXALATE 10 MG PO TABS: 10.0000 mg | ORAL_TABLET | Freq: Every day | ORAL | 1 refills | Status: DC

## 2021-11-09 NOTE — Assessment & Plan Note (Signed)
She is having increased depressive symptoms at this time.  Restarting Lexapro at 10 mg.  Continue this for a period of 4 weeks and we can titrate up if needed and if tolerating well.  Continue seeing her therapist regularly.  Follow-up with me in 4 weeks.

## 2021-11-09 NOTE — Progress Notes (Signed)
April Wilkerson - 26 y.o. female MRN [id number]  Date of birth: 08/25/1995   This visit type was conducted due to national recommendations for restrictions regarding the COVID-19 Pandemic (e.g. social distancing).  This format is felt to be most appropriate for this patient at this time.  All issues noted in this document were discussed and addressed.  No physical exam was performed (except for noted visual exam findings with Video Visits).  I discussed the limitations of evaluation and management by telemedicine and the availability of in person appointments. The patient expressed understanding and agreed to proceed.  I connected withNAME@ on 11/09/21 at 11:30 AM EDT by a video enabled telemedicine application and verified that I am speaking with the correct person using two identifiers.  Present at visit: [name], DO Rolando Charna Busman   Patient Location: Home [address] [address] [place] [address]   Provider location:   [facility]  Chief Complaint  Patient presents with   Medication Refill    HPI  April Wilkerson is a 26 y.o. female who presents via audio/video conferencing for a telehealth visit today.  Follow-up today for depression and anxiety.  Reports that symptoms seem to be worsened since prior visit.  She had stopped Lexapro previously as she felt like symptoms have plateaued and she was not getting any continued improvement from this.  She is seeing a therapist.  She has increasing frequency from monthly to biweekly.  She would like to restart her Lexapro at this point.     11/09/2021   11:27 AM 11/08/2021    9:09 AM 12/12/2020    8:19 AM  Depression screen PHQ 2/9  Decreased Interest 1  0  Down, Depressed, Hopeless 1  1  PHQ - 2 Score 2  1  Altered sleeping 2  0  Tired, decreased energy 2  1  Change in appetite 1  0  Feeling bad or failure about yourself  2  1  Trouble concentrating 2  0  Moving slowly or fidgety/restless 0  0  Suicidal thoughts 1  0   PHQ-9 Score 12  3  Difficult doing work/chores Somewhat difficult  Somewhat difficult     Information is confidential and restricted. Go to Review Flowsheets to unlock data.      ROS:  A comprehensive ROS was completed and negative except as noted per HPI  Past Medical History:  Diagnosis Date   Anemia    Asthma, chronic 10/13/2013   Palpitations    Vitamin D deficiency     History reviewed. No pertinent surgical history.  Family History  Problem Relation Age of Onset   Cancer Mother        breast cancer, not sure what age diagnosed    Hypertension Father     Social History   Socioeconomic History   Marital status: Single    Spouse name: Not on file   Number of children: Not on file   Years of education: Not on file   Highest education level: Not on file  Occupational History   Occupation: student    Comment: studying Development worker, community at [employer]   Tobacco Use   Smoking status: Never   Smokeless tobacco: Never  Vaping Use   Vaping Use: Never used  Substance and Sexual Activity   Alcohol use: No   Drug use: Never   Sexual activity: Yes    Birth control/protection: None    Comment: none needed at the moment   Other  Topics Concern   Not on file  Social History Narrative   ** Merged History Encounter **       Social Determinants of Health   Financial Resource Strain: Not on file  Food Insecurity: Not on file  Transportation Needs: Not on file  Physical Activity: Not on file  Stress: Not on file  Social Connections: Not on file  Intimate Partner Violence: Not on file     Current Outpatient Medications:    albuterol (VENTOLIN HFA) 108 (90 Base) MCG/ACT inhaler, Inhale 1-2 puffs into the lungs every 4 (four) hours as needed for wheezing or shortness of breath., Disp: 18 g, Rfl: 1   ASHWAGANDHA PO, Take by mouth., Disp: , Rfl:    aspirin-acetaminophen-caffeine (EXCEDRIN MIGRAINE) 250-250-65 MG tablet, Take 1-2 tablets by mouth every 6 (six) hours as  needed for headache., Disp: 30 tablet, Rfl: 0   fluticasone (FLONASE) 50 MCG/ACT nasal spray, Place 2 sprays into both nostrils daily., Disp: 16 g, Rfl: 0   levocetirizine (XYZAL) 5 MG tablet, Take 1 tablet (5 mg total) by mouth every evening., Disp: 90 tablet, Rfl: 3   montelukast (SINGULAIR) 10 MG tablet, Take 1 tablet (10 mg total) by mouth at bedtime., Disp: 90 tablet, Rfl: 3   MULTIPLE VITAMIN PO, Take 1 tablet by mouth daily., Disp: , Rfl:    OVER THE COUNTER MEDICATION, Safron, Disp: , Rfl:    Probiotic Product (PROBIOTIC-10 PO), Take by mouth., Disp: , Rfl:    tranexamic acid (LYSTEDA) 650 MG TABS tablet, Take 2 tablets (1,300 mg total) by mouth 3 (three) times daily. Take during menses for a maximum of five days, Disp: 30 tablet, Rfl: 6   escitalopram (LEXAPRO) 10 MG tablet, Take 1 tablet (10 mg total) by mouth daily., Disp: 90 tablet, Rfl: 1  EXAM:  VITALS per patient if applicable: Ht 5' 4.5" (1.638 m)   Wt 208 lb (94.3 kg)   BMI 35.15 kg/m   GENERAL: alert, oriented, appears well and in no acute distress  HEENT: atraumatic, conjunttiva clear, no obvious abnormalities on inspection of external nose and ears  NECK: normal movements of the head and neck  LUNGS: on inspection no signs of respiratory distress, breathing rate appears normal, no obvious gross SOB, gasping or wheezing  CV: no obvious cyanosis  MS: moves all visible extremities without noticeable abnormality  PSYCH/NEURO: pleasant and cooperative, no obvious depression or anxiety, speech and thought processing grossly intact  ASSESSMENT AND PLAN:  Discussed the following assessment and plan:  Major depressive disorder, recurrent episode, moderate (HCC) She is having increased depressive symptoms at this time.  Restarting Lexapro at 10 mg.  Continue this for a period of 4 weeks and we can titrate up if needed and if tolerating well.  Continue seeing her therapist regularly.  Follow-up with me in 4 weeks.      I discussed the assessment and treatment plan with the patient. The patient was provided an opportunity to ask questions and all were answered. The patient agreed with the plan and demonstrated an understanding of the instructions.   The patient was advised to call back or seek an in-person evaluation if the symptoms worsen or if the condition fails to improve as anticipated.    [name], DO

## 2021-11-15 ENCOUNTER — Encounter: Payer: Self-pay | Admitting: Professional

## 2021-11-15 ENCOUNTER — Ambulatory Visit (INDEPENDENT_AMBULATORY_CARE_PROVIDER_SITE_OTHER): Payer: 59 | Admitting: Professional

## 2021-11-15 DIAGNOSIS — F331 Major depressive disorder, recurrent, moderate: Secondary | ICD-10-CM | POA: Diagnosis not present

## 2021-11-15 NOTE — Progress Notes (Signed)
[facility] Counselor/Therapist Progress Note  Patient ID: April Wilkerson, MRN: [id number],    Date: 10/25/2021  Time Spent: 51 minutes [id number]  Treatment Type: Individual Therapy  Risk Assessment: Danger to Self:  No Self-injurious Behavior: No Danger to Others: No  Subjective: This session was held via video teletherapy. The patient consented to video teletherapy and was located at her home during this session. She is aware it is the responsibility of the patient to secure confidentiality on her end of the session. The provider was in a private home office for the duration of this session.   The patient arrived late for her webex appointment.  Issues addressed: 1-mood -less depressed -PHQ-9    11/15/2021    3:09 PM 11/09/2021   11:27 AM  Depression screen PHQ 2/9  Decreased Interest  1  Down, Depressed, Hopeless 1 1  PHQ - 2 Score 1 2  Altered sleeping 1 2  Tired, decreased energy 1 2  Change in appetite 1 1  Feeling bad or failure about yourself  1 2  Trouble concentrating 0 2  Moving slowly or fidgety/restless 0 0  Suicidal thoughts 1 1  PHQ-9 Score 6 12  Difficult doing work/chores Somewhat difficult Somewhat difficult  2-medication -met with Dr. Zigmund Daniel -Escitaolapram 54m and try to wait four weeks to get back in system   -if not improvement they can look at increasing to 275mor add another medication to her regime -pt struggling with insomnia and that is unusual 3-professional -had conversation with boss about her depression -boss was very understanding and gave pt's really big hug -recommended she take some time off   -[facility]   -she was going to go by herself but she did not think it was a good idea   -pt reports she invited [employer]ela Millinor support   -it was a really nice weekend -took Monday off and boss told her that she would see her sometimes in this  week   -boss called last night to check on her   -told her she was bette than she was   -boss very supportive and said she can take the rest of the week -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   -she doesn't think it is a good fit   -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 3-personal -WeKela Millinas been a really great support system -talked to [place] that was great too   -she is also struggling and it was comforting to know she is not the only one in a tough place right now   -it was good to reciprocate the support and for her to know she is not alone -patient was able to identify not having time for herself and not being medicated are additional triggers that she had not considered   "I felt like I could never catch a break, not just with myself but financially."  Treatment Plan Problems Addressed  Anxiety, Childhood Trauma, Financial Stress, Intimate Relationship Conflicts Goals 1. Achieve an inner strength to control personal impulses, cravings, and desires that directly or indirectly increase debt irresponsibly. 2. Develop an awareness of how childhood issues have affected and continue to affect one's family life. Objective Identify the positive aspects for self of being able to forgive all those involved with the abuse. Target Date: 2022-08-22 Frequency: Biweekly  Progress:  0 Modality: individual  3. Develop the necessary skills for effective, open communication, mutually satisfying sexual intimacy, and enjoyable time for companionship within the relationship. 4. Enhance ability to effectively cope with the full variety of life's worries and anxieties. 5. Gain a new sense of self-worth in which the substance of one's value is not attached to the capacity to do things or own things that cost money. Objective Identify personal traits that make undisciplined spending possible. Target Date: 2022-08-22 Frequency: Biweekly  Progress: 0  Modality: individual  Objective Use cognitive and behavioral strategies to control the impulse to make unnecessary and unaffordable purchases. Target Date: 2022-08-22 Frequency: Biweekly  Progress: 0 Modality: individual  Objective Write a budget that balances income with expenses. Target Date: 2022-08-22 Frequency: Biweekly  Progress: 0 Modality: individual  Objective Report instances of successful control over impulse to spend on unnecessary expenses. Target Date: 2022-08-22 Frequency: Biweekly  Progress: 0 Modality: individual  Objective Keep weekly and monthly records of financial income and expenses. Target Date: 2022-08-22 Frequency: Biweekly  Progress: 0 Modality: individual  6. Increase awareness of own role in the relationship conflicts. Objective Identify problems and strengths in the relationship, including one's own role in each. Target Date: 2022-08-22 Frequency: Biweekly  Progress: 0 Modality: individual  Objective Understand the origin of each other's negative emotions and reactions and develop more constructive interactions that fill needs. Target Date: 2022-08-22 Frequency: Biweekly  Progress: 0 Modality: individual  Objective Identify any patterns of destructive and/or abusive behavior in the relationship. Target Date: 2022-08-22 Frequency: Biweekly  Progress: 0 Modality: individual  Objective Increase flexibility of expectations, willingness to compromise, and acceptance of irreconcilable differences. Target Date: 2022-08-22 Frequency: Biweekly  Progress: 0 Modality: individual  7. Learn and implement coping skills that result in a reduction of anxiety and worry, and improved daily functioning. Objective Learn and implement calming skills to reduce overall anxiety and manage anxiety symptoms. Target Date: 2022-08-22 Frequency: Biweekly  Progress: 0 Modality: individual  Related Interventions Teach the client calming/relaxation skills (e.g., applied  relaxation, progressive muscle relaxation, cue controlled relaxation; mindful breathing; biofeedback) and how to discriminate better between relaxation and tension; teach the client how to apply these skills to his/her daily life (e.g., New Directions in Progressive Muscle Relaxation by Casper Harrison, and Hazlett-Stevens; Treating Generalized Anxiety Disorder by Rygh and Amparo Bristol). Assign the client homework each session in which he/she practices relaxation exercises daily, gradually applying them progressively from non-anxiety-provoking to anxiety-provoking situations; review and reinforce success while providing corrective feedback toward improvement. Objective Learn and implement problem-solving strategies for realistically addressing worries. Target Date: 2022-08-22 Frequency: Biweekly  Progress: 0 Modality: individual  Related Interventions Assign the client a homework exercise in which he/she problem-solves a current problem (see Mastery of Your Anxiety and Worry: Workbook by Adora Fridge and Eliot Ford or Generalized Anxiety Disorder by Eather Colas, and Eliot Ford); review, reinforce success, and provide corrective feedback toward improvement. Teach the client problem-solving strategies involving specifically defining a problem, generating options for addressing it, evaluating the pros and cons of each option, selecting and implementing an optional action, and reevaluating and refining the action (or assign "Applying Problem-Solving to Interpersonal Conflict" in the Adult Psychotherapy Homework Planner by Bryn Gulling). Objective Reestablish a consistent sleep-wake cycle. Target Date: 2022-08-22 Frequency: Biweekly  Progress: 0 Modality: individual  Related Interventions Teach and implement sleep hygiene practices to help the client reestablish a consistent sleep-wake cycle; review, reinforce success, and provide corrective feedback toward improvement. 8. Let go of blame and begin to forgive  others for  pain caused in childhood. Objective Identify patterns of abuse, neglect, or abandonment within the family of origin, both current and historical, nuclear and extended. Target Date: 2022-08-22 Frequency: Biweekly  Progress: 0 Modality: individual  Related Interventions Assign the client to ask parents about their family backgrounds and develop insight regarding patterns of behavior and causes for parents' dysfunction. Explore the client's painful childhood experiences (or assign "Share the Painful Memory" in the Adult Psychotherapy Homework Planner by Bryn Gulling). Objective Identify feelings associated with major traumatic incidents in childhood and with parental child-rearing patterns. Target Date: 2022-08-22 Frequency: Biweekly  Progress: 0 Modality: individual  Related Interventions Support and encourage the client when he/she begins to express feelings of rage, sadness, fear, and rejection relating to family abuse or neglect. Assign the client to record feelings in a journal that describes memories, behavior, and emotions tied to his/her traumatic childhood experiences (or assign "How the Trauma Affects Me" in the Adult Psychotherapy Homework Planner by Bryn Gulling). Objective Increase level of trust of others as shown by more socialization and greater intimacy tolerance. Target Date: 2022-08-22 Frequency: Biweekly  Progress: 0 Modality: individual  Related Interventions Teach the client the share-check method of building trust in relationships (sharing a little information and checking as to the recipient's sensitivity in reacting to that information). Teach the client the advantages of treating people as trustworthy given a reasonable amount of time to assess their character. 9. Release the emotions associated with past childhood/family issues, resulting in less resentment and more serenity. 10. Resolve past childhood/family issues, leading to less anger and depression, greater self-esteem,  security, and confidence. 11. Stabilize anxiety level while increasing ability to function on a daily basis. Objective Learn and implement relapse prevention strategies for managing possible future anxiety symptoms. Target Date: 2022-08-22 Frequency: Biweekly  Progress: 0 Modality: individual  12. Understand personal desires, insecurities, and anxieties that make overspending possible.  Diagnosis:Major depressive disorder, recurrent episode, moderate ([place])  Plan:  -contact PCP to discuss sleep issues -meet with career coach as scheduled -challenging the irrational thinking meet again in one week on Thursday, November 23, 2021 at [place], [facility]

## 2021-11-23 ENCOUNTER — Ambulatory Visit (INDEPENDENT_AMBULATORY_CARE_PROVIDER_SITE_OTHER): Payer: 59 | Admitting: Professional

## 2021-11-23 ENCOUNTER — Encounter: Payer: Self-pay | Admitting: Professional

## 2021-11-23 DIAGNOSIS — F331 Major depressive disorder, recurrent, moderate: Secondary | ICD-10-CM | POA: Diagnosis not present
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
## 2021-11-29 ENCOUNTER — Ambulatory Visit: Payer: 59 | Admitting: Professional

## 2021-12-06 ENCOUNTER — Telehealth (INDEPENDENT_AMBULATORY_CARE_PROVIDER_SITE_OTHER): Payer: 59 | Admitting: Family Medicine

## 2021-12-06 ENCOUNTER — Encounter: Payer: Self-pay | Admitting: Family Medicine

## 2021-12-06 DIAGNOSIS — F331 Major depressive disorder, recurrent, moderate: Secondary | ICD-10-CM

## 2021-12-06 MED ORDER — HYDROXYZINE PAMOATE 25 MG PO CAPS: 25.0000 mg | ORAL_CAPSULE | Freq: Three times a day (TID) | ORAL | 1 refills | Status: DC | PRN

## 2021-12-06 MED ORDER — ESCITALOPRAM OXALATE 20 MG PO TABS: 20.0000 mg | ORAL_TABLET | Freq: Every day | ORAL | 1 refills | Status: DC

## 2021-12-06 NOTE — Progress Notes (Signed)
April Wilkerson - 26 y.o. female MRN [id number]  Date of birth: 1996-02-10   This visit type was conducted due to national recommendations for restrictions regarding the COVID-19 Pandemic (e.g. social distancing).  This format is felt to be most appropriate for this patient at this time.  All issues noted in this document were discussed and addressed.  No physical exam was performed (except for noted visual exam findings with Video Visits).  I discussed the limitations of evaluation and management by telemedicine and the availability of in person appointments. The patient expressed understanding and agreed to proceed.  I connected withNAME@ on 12/06/21 at  1:10 PM EDT by a video enabled telemedicine application and verified that I am speaking with the correct person using two identifiers.  Present at visit: Luetta Nutting, DO [place]   Patient Location: [place] [address]   Provider location:   [facility]  Chief Complaint  Patient presents with   Depression    HPI  April Wilkerson is a 26 y.o. female who presents via audio/video conferencing for a telehealth visit today.  She is following up today for depression and anxiety.  Current treatment with lexapro 10mg  daily.  She has noted improvement with this however still has some breakthrough anxiety.  Also having some sleep difficulty.  Wants to know if something can be adjusted or added to the lexapro.  She is tolerating lexapro well at current strength.     ROS:  A comprehensive ROS was completed and negative except as noted per HPI  Past Medical History:  Diagnosis Date   Anemia    Asthma, chronic 10/13/2013   Palpitations    Vitamin D deficiency     History reviewed. No pertinent surgical history.  Family History  Problem Relation Age of Onset   Cancer Mother        breast cancer, not sure what age diagnosed    Hypertension Father     Social History   Socioeconomic History    Marital status: Single    Spouse name: Not on file   Number of children: Not on file   Years of education: Not on file   Highest education level: Not on file  Occupational History   Occupation: student    Comment: studying Museum/gallery exhibitions officer at [employer]   Tobacco Use   Smoking status: Never   Smokeless tobacco: Never  Vaping Use   Vaping Use: Never used  Substance and Sexual Activity   Alcohol use: No   Drug use: Never   Sexual activity: Yes    Birth control/protection: None    Comment: none needed at the moment   Other Topics Concern   Not on file  Social History Narrative   ** Merged History Encounter **       Social Determinants of Health   Financial Resource Strain: Not on file  Food Insecurity: Not on file  Transportation Needs: Not on file  Physical Activity: Not on file  Stress: Not on file  Social Connections: Not on file  Intimate Partner Violence: Not on file     Current Outpatient Medications:    albuterol (VENTOLIN HFA) 108 (90 Base) MCG/ACT inhaler, Inhale 1-2 puffs into the lungs every 4 (four) hours as needed for wheezing or shortness of breath., Disp: 18 g, Rfl: 1   ASHWAGANDHA PO, Take by mouth., Disp: , Rfl:    aspirin-acetaminophen-caffeine (EXCEDRIN MIGRAINE) 250-250-65 MG tablet, Take 1-2 tablets by mouth every  6 (six) hours as needed for headache., Disp: 30 tablet, Rfl: 0   fluticasone (FLONASE) 50 MCG/ACT nasal spray, Place 2 sprays into both nostrils daily., Disp: 16 g, Rfl: 0   hydrOXYzine (VISTARIL) 25 MG capsule, Take 1 capsule (25 mg total) by mouth 3 (three) times daily as needed for anxiety., Disp: 90 capsule, Rfl: 1   levocetirizine (XYZAL) 5 MG tablet, Take 1 tablet (5 mg total) by mouth every evening., Disp: 90 tablet, Rfl: 3   montelukast (SINGULAIR) 10 MG tablet, Take 1 tablet (10 mg total) by mouth at bedtime., Disp: 90 tablet, Rfl: 3   MULTIPLE VITAMIN PO, Take 1 tablet by mouth daily., Disp: , Rfl:    OVER THE COUNTER MEDICATION,  Safron, Disp: , Rfl:    Probiotic Product (PROBIOTIC-10 PO), Take by mouth., Disp: , Rfl:    tranexamic acid (LYSTEDA) 650 MG TABS tablet, Take 2 tablets (1,300 mg total) by mouth 3 (three) times daily. Take during menses for a maximum of five days, Disp: 30 tablet, Rfl: 6   escitalopram (LEXAPRO) 20 MG tablet, Take 1 tablet (20 mg total) by mouth daily., Disp: 90 tablet, Rfl: 1  EXAM:  VITALS per patient if applicable: There were no vitals taken for this visit.  GENERAL: alert, oriented, appears well and in no acute distress  HEENT: atraumatic, conjunttiva clear, no obvious abnormalities on inspection of external nose and ears  NECK: normal movements of the head and neck  LUNGS: on inspection no signs of respiratory distress, breathing rate appears normal, no obvious gross SOB, gasping or wheezing  CV: no obvious cyanosis  MS: moves all visible extremities without noticeable abnormality  PSYCH/NEURO: pleasant and cooperative, no obvious depression or anxiety, speech and thought processing grossly intact  ASSESSMENT AND PLAN:  Discussed the following assessment and plan:  Major depressive disorder, recurrent episode, moderate ([facility]) Depression is better with current dose of lexapro but continues to have anxiety.  Will increase lexapro to 20mg  daily.  Adding hydroxyzine 25mg  tid prn.  F/u in 2 months.      I discussed the assessment and treatment plan with the patient. The patient was provided an opportunity to ask questions and all were answered. The patient agreed with the plan and demonstrated an understanding of the instructions.   The patient was advised to call back or seek an in-person evaluation if the symptoms worsen or if the condition fails to improve as anticipated.    , DO

## 2021-12-06 NOTE — Assessment & Plan Note (Signed)
Depression is better with current dose of lexapro but continues to have anxiety.  Will increase lexapro to 20mg  daily.  Adding hydroxyzine 25mg  tid prn.  F/u in 2 months.

## 2021-12-07 ENCOUNTER — Ambulatory Visit: Payer: 59 | Admitting: Professional

## 2021-12-21 ENCOUNTER — Ambulatory Visit: Payer: 59 | Admitting: Professional

## 2022-01-04 ENCOUNTER — Encounter: Payer: Self-pay | Admitting: Professional

## 2022-01-04 ENCOUNTER — Ambulatory Visit (INDEPENDENT_AMBULATORY_CARE_PROVIDER_SITE_OTHER): Payer: 59 | Admitting: Professional

## 2022-01-04 DIAGNOSIS — F331 Major depressive disorder, recurrent, moderate: Secondary | ICD-10-CM | POA: Diagnosis not present

## 2022-01-04 DIAGNOSIS — F4322 Adjustment disorder with anxiety: Secondary | ICD-10-CM | POA: Diagnosis not present

## 2022-01-04 NOTE — Progress Notes (Signed)
[facility] Counselor/Therapist Progress Note  Patient ID: April Wilkerson, MRN: [id number],    Date: 01/04/2022  Time Spent: 50 minutes [id number]  Treatment Type: Individual Therapy  Risk Assessment: Danger to Self:  No Self-injurious Behavior: No Danger to Others: No  Subjective: This session was held via video teletherapy. The patient consented to video teletherapy and was located at her home during this session. She is aware it is the responsibility of the patient to secure confidentiality on her end of the session. The provider was in a private home office for the duration of this session.   The patient arrived late for her webex appointment.  Issues addressed: 1-homework -contact PCP to discuss sleep issues- did not want medication and instead prayed that God would allow her to rest in his presence -meet with career coach as scheduled-met with coach who was supportive and reminded her that she needs no answers tomorrow.   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 -pt updated supervisor yesterday   -supervisor stopped her from continuing ad send to a vendor to complete   -supervisor told her to work on other things that her doing the online work was unproductive -explained to her supervisor who was supportive -pt insightful about their different working styles   -when the online task was pulled pt stated "that was the straw that broke the camels back"   -pt admits her thought was that she wasn't good enough   -pt admits she stayed so long because of fear that she can't find  something else c-pt is looking to understand what she learned and how she grew   -admits that the person managing her is very important     -pt learned that her style of manager really effected her   -look at tasks and determine must what she can live with vs a deal breaker d-moving forward   -planning to pick up extra hours at other employer   -going to do some thinking about her next steps   -will get another job to offset her budget   -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 Achieve an inner strength to control personal impulses, cravings, and desires that directly or indirectly increase debt irresponsibly. 2. Develop an awareness of how childhood issues have affected and continue to affect one's family life. Objective Identify the positive aspects for self of being able to forgive all those involved with the abuse. Target Date: 2022-08-22 Frequency: Biweekly  Progress: 0 Modality: individual  3. Develop the necessary skills for effective, open communication, mutually satisfying sexual intimacy, and enjoyable time for companionship within the relationship. 4. Enhance ability to effectively cope with the full variety of life's worries and anxieties. 5. Gain a new sense of self-worth in which the substance of one's value is not attached to the capacity to do things or own things that cost money. Objective Identify personal traits that make undisciplined spending possible. Target Date: 2022-08-22 Frequency: Biweekly  Progress:  0 Modality: individual  Objective Use cognitive and behavioral strategies to control the impulse to make unnecessary and unaffordable purchases. Target Date: 2022-08-22 Frequency: Biweekly  Progress: 0 Modality: individual  Objective Write a budget that balances income with expenses. Target Date: 2022-08-22 Frequency:  Biweekly  Progress: 0 Modality: individual  Objective Report instances of successful control over impulse to spend on unnecessary expenses. Target Date: 2022-08-22 Frequency: Biweekly  Progress: 0 Modality: individual  Objective Keep weekly and monthly records of financial income and expenses. Target Date: 2022-08-22 Frequency: Biweekly  Progress: 0 Modality: individual  6. Increase awareness of own role in the relationship conflicts. Objective Identify problems and strengths in the relationship, including one's own role in each. Target Date: 2022-08-22 Frequency: Biweekly  Progress: 0 Modality: individual  Objective Understand the origin of each other's negative emotions and reactions and develop more constructive interactions that fill needs. Target Date: 2022-08-22 Frequency: Biweekly  Progress: 0 Modality: individual  Objective Identify any patterns of destructive and/or abusive behavior in the relationship. Target Date: 2022-08-22 Frequency: Biweekly  Progress: 0 Modality: individual  Objective Increase flexibility of expectations, willingness to compromise, and acceptance of irreconcilable differences. Target Date: 2022-08-22 Frequency: Biweekly  Progress: 0 Modality: individual  7. Learn and implement coping skills that result in a reduction of anxiety and worry, and improved daily functioning. Objective Learn and implement calming skills to reduce overall anxiety and manage anxiety symptoms. Target Date: 2022-08-22 Frequency: Biweekly  Progress: 0 Modality: individual  Related Interventions Teach the client calming/relaxation skills (e.g., applied relaxation, progressive muscle relaxation, cue controlled relaxation; mindful breathing; biofeedback) and how to discriminate better between relaxation and tension; teach the client how to apply these skills to his/her daily life (e.g., New Directions in Progressive Muscle Relaxation by Casper Harrison, and Hazlett-Stevens;  Treating Generalized Anxiety Disorder by Rygh and Amparo Bristol). Assign the client homework each session in which he/she practices relaxation exercises daily, gradually applying them progressively from non-anxiety-provoking to anxiety-provoking situations; review and reinforce success while providing corrective feedback toward improvement. Objective Learn and implement problem-solving strategies for realistically addressing worries. Target Date: 2022-08-22 Frequency: Biweekly  Progress: 0 Modality: individual  Related Interventions Assign the client a homework exercise in which he/she problem-solves a current problem (see Mastery of Your Anxiety and Worry: Workbook by Adora Fridge and Eliot Ford or Generalized Anxiety Disorder by Eather Colas, and Eliot Ford); review, reinforce success, and provide corrective feedback toward improvement. Teach the client problem-solving strategies involving specifically defining a problem, generating options for addressing it, evaluating the pros and cons of each option, selecting and implementing an optional action, and reevaluating and refining the action (or assign "Applying Problem-Solving to Interpersonal Conflict" in the Adult Psychotherapy Homework Planner by Bryn Gulling). Objective Reestablish a consistent sleep-wake cycle. Target Date: 2022-08-22 Frequency: Biweekly  Progress: 0 Modality: individual  Related Interventions Teach and implement sleep hygiene practices to help the client reestablish a consistent sleep-wake cycle; review, reinforce success, and provide corrective feedback toward improvement. 8. Let go of blame and begin to forgive others for pain caused in childhood. Objective Identify patterns of abuse, neglect, or abandonment within the family of origin, both current and historical, nuclear and extended. Target Date: 2022-08-22 Frequency: Biweekly  Progress: 0 Modality: individual  Related Interventions Assign the client to ask parents about their family  backgrounds and develop insight regarding patterns of behavior and causes for parents' dysfunction. Explore the client's painful childhood experiences (or assign "Share the Painful Memory" in the Adult Psychotherapy Homework Planner by Bryn Gulling). Objective Identify feelings  associated with major traumatic incidents in childhood and with parental child-rearing patterns. Target Date: 2022-08-22 Frequency: Biweekly  Progress: 0 Modality: individual  Related Interventions Support and encourage the client when he/she begins to express feelings of rage, sadness, fear, and rejection relating to family abuse or neglect. Assign the client to record feelings in a journal that describes memories, behavior, and emotions tied to his/her traumatic childhood experiences (or assign "How the Trauma Affects Me" in the Adult Psychotherapy Homework Planner by Bryn Gulling). Objective Increase level of trust of others as shown by more socialization and greater intimacy tolerance. Target Date: 2022-08-22 Frequency: Biweekly  Progress: 0 Modality: individual  Related Interventions Teach the client the share-check method of building trust in relationships (sharing a little information and checking as to the recipient's sensitivity in reacting to that information). Teach the client the advantages of treating people as trustworthy given a reasonable amount of time to assess their character. 9. Release the emotions associated with past childhood/family issues, resulting in less resentment and more serenity. 10. Resolve past childhood/family issues, leading to less anger and depression, greater self-esteem, security, and confidence. 11. Stabilize anxiety level while increasing ability to function on a daily basis. Objective Learn and implement relapse prevention strategies for managing possible future anxiety symptoms. Target Date: 2022-08-22 Frequency: Biweekly  Progress: 0 Modality: individual  12. Understand personal  desires, insecurities, and anxieties that make overspending possible.  Diagnosis:Major depressive disorder, recurrent episode, moderate ([place])  Adjustment disorder with anxiety  Plan:  -meet again on Thursday, Novtember 16, 2023 at [place], [facility]

## 2022-01-18 ENCOUNTER — Ambulatory Visit: Payer: 59 | Admitting: Professional

## 2022-02-01 ENCOUNTER — Encounter: Payer: Self-pay | Admitting: Professional

## 2022-02-01 ENCOUNTER — Ambulatory Visit (INDEPENDENT_AMBULATORY_CARE_PROVIDER_SITE_OTHER): Payer: 59 | Admitting: Professional

## 2022-02-01 DIAGNOSIS — F4322 Adjustment disorder with anxiety: Secondary | ICD-10-CM

## 2022-02-01 DIAGNOSIS — F331 Major depressive disorder, recurrent, moderate: Secondary | ICD-10-CM

## 2022-02-01 NOTE — Progress Notes (Signed)
[facility] Counselor/Therapist Progress Note  Patient ID: April Wilkerson, MRN: [id number],    Date: 01/04/2022  Time Spent: 38 minutes [id number]  Treatment Type: Individual Therapy  Risk Assessment: Danger to Self:  No Self-injurious Behavior: No Danger to Others: No  Subjective: This session was held via video teletherapy. The patient consented to video teletherapy and was located at her home during this session. She is aware it is the responsibility of the patient to secure confidentiality on her end of the session. The provider was in a private home office for the duration of this session.   The patient arrived late for her webex appointment.  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 -increased income and benefits after 90 days   -pt states she feels like she's getting paid what she's worth -she will be working M-Th 2-mood a-has been up and down over the past few weeks -leaving one employer and starting with the new one -she gave herself a few days off b-grandmother had a stroke -pt found her grandmother on the floor   -she was off work and available for her grandmother   -when she arrived she noticed her yard light was still on     -she could not open door due ot night stick     -could hear but not understand grandmother     -was able to get the night stick out of way -she is still in hospital   -initially she did not know who she was    -she sees her grandmother almost daily   -pt thinks that she is accepting that time (death) is going to come     -she is hopeful that she will heal on earth   -pt admits some fear of not being there     -her mom had told her about one time per week that she needs to rest and not go to hospital     -before she first started the job she was spending hours with her     -she picked up and returned April Wilkerson to school   -pt admits it was a  shock when she did not know her but pt denies that it bothered her     -pt reports her not engaging and being alert has been most difficult   -pt, her mom, and her sister April Wilkerson are checking in on each other   -discussed importance of being transparent with her feelings and not just care of each other     -pt reports her family has been supportive of her     -pt's aunt was there and very helpful when she finally broke down     Treatment Plan Problems Addressed  Anxiety, Childhood Trauma, Financial Stress, Intimate Relationship Conflicts Goals 1. Achieve an inner strength to control personal impulses, cravings, and desires that directly or indirectly increase debt irresponsibly. 2. Develop an awareness of how childhood issues have affected and continue to affect one's family life. Objective Identify the positive aspects for self of being able to forgive all those involved with the abuse. Target Date: 2022-08-22 Frequency: Biweekly  Progress: 0 Modality: individual  3. Develop the necessary skills for effective, open communication, mutually satisfying sexual intimacy, and enjoyable time for companionship within the relationship. 4. Enhance ability to effectively cope with the full variety of life's worries and anxieties. 5. Gain a new sense of self-worth in which the substance of one's  value is not attached to the capacity to do things or own things that cost money. Objective Identify personal traits that make undisciplined spending possible. Target Date: 2022-08-22 Frequency: Biweekly  Progress: 0 Modality: individual  Objective Use cognitive and behavioral strategies to control the impulse to make unnecessary and unaffordable purchases. Target Date: 2022-08-22 Frequency: Biweekly  Progress: 0 Modality: individual  Objective Write a budget that balances income with expenses. Target Date: 2022-08-22 Frequency: Biweekly  Progress: 0 Modality: individual  Objective Report instances of  successful control over impulse to spend on unnecessary expenses. Target Date: 2022-08-22 Frequency: Biweekly  Progress: 0 Modality: individual  Objective Keep weekly and monthly records of financial income and expenses. Target Date: 2022-08-22 Frequency: Biweekly  Progress: 0 Modality: individual  6. Increase awareness of own role in the relationship conflicts. Objective Identify problems and strengths in the relationship, including one's own role in each. Target Date: 2022-08-22 Frequency: Biweekly  Progress: 0 Modality: individual  Objective Understand the origin of each other's negative emotions and reactions and develop more constructive interactions that fill needs. Target Date: 2022-08-22 Frequency: Biweekly  Progress: 0 Modality: individual  Objective Identify any patterns of destructive and/or abusive behavior in the relationship. Target Date: 2022-08-22 Frequency: Biweekly  Progress: 0 Modality: individual  Objective Increase flexibility of expectations, willingness to compromise, and acceptance of irreconcilable differences. Target Date: 2022-08-22 Frequency: Biweekly  Progress: 0 Modality: individual  7. Learn and implement coping skills that result in a reduction of anxiety and worry, and improved daily functioning. Objective Learn and implement calming skills to reduce overall anxiety and manage anxiety symptoms. Target Date: 2022-08-22 Frequency: Biweekly  Progress: 0 Modality: individual  Related Interventions Teach the client calming/relaxation skills (e.g., applied relaxation, progressive muscle relaxation, cue controlled relaxation; mindful breathing; biofeedback) and how to discriminate better between relaxation and tension; teach the client how to apply these skills to his/her daily life (e.g., New Directions in Progressive Muscle Relaxation by Marcelyn Ditty, and Hazlett-Stevens; Treating Generalized Anxiety Disorder by Rygh and Ida Rogue). Assign the client  homework each session in which he/she practices relaxation exercises daily, gradually applying them progressively from non-anxiety-provoking to anxiety-provoking situations; review and reinforce success while providing corrective feedback toward improvement. Objective Learn and implement problem-solving strategies for realistically addressing worries. Target Date: 2022-08-22 Frequency: Biweekly  Progress: 0 Modality: individual  Related Interventions Assign the client a homework exercise in which he/she problem-solves a current problem (see Mastery of Your Anxiety and Worry: Workbook by Elenora Fender and Filbert Schilder or Generalized Anxiety Disorder by Elesa Hacker, and Filbert Schilder); review, reinforce success, and provide corrective feedback toward improvement. Teach the client problem-solving strategies involving specifically defining a problem, generating options for addressing it, evaluating the pros and cons of each option, selecting and implementing an optional action, and reevaluating and refining the action (or assign "Applying Problem-Solving to Interpersonal Conflict" in the Adult Psychotherapy Homework Planner by Stephannie Li). Objective Reestablish a consistent sleep-wake cycle. Target Date: 2022-08-22 Frequency: Biweekly  Progress: 0 Modality: individual  Related Interventions Teach and implement sleep hygiene practices to help the client reestablish a consistent sleep-wake cycle; review, reinforce success, and provide corrective feedback toward improvement. 8. Let go of blame and begin to forgive others for pain caused in childhood. Objective Identify patterns of abuse, neglect, or abandonment within the family of origin, both current and historical, nuclear and extended. Target Date: 2022-08-22 Frequency: Biweekly  Progress: 0 Modality: individual  Related Interventions Assign the client to ask parents about their family backgrounds and develop insight regarding  patterns of behavior and causes for  parents' dysfunction. Explore the client's painful childhood experiences (or assign "Share the Painful Memory" in the Adult Psychotherapy Homework Planner by Stephannie Li). Objective Identify feelings associated with major traumatic incidents in childhood and with parental child-rearing patterns. Target Date: 2022-08-22 Frequency: Biweekly  Progress: 0 Modality: individual  Related Interventions Support and encourage the client when he/she begins to express feelings of rage, sadness, fear, and rejection relating to family abuse or neglect. Assign the client to record feelings in a journal that describes memories, behavior, and emotions tied to his/her traumatic childhood experiences (or assign "How the Trauma Affects Me" in the Adult Psychotherapy Homework Planner by Stephannie Li). Objective Increase level of trust of others as shown by more socialization and greater intimacy tolerance. Target Date: 2022-08-22 Frequency: Biweekly  Progress: 0 Modality: individual  Related Interventions Teach the client the share-check method of building trust in relationships (sharing a little information and checking as to the recipient's sensitivity in reacting to that information). Teach the client the advantages of treating people as trustworthy given a reasonable amount of time to assess their character. 9. Release the emotions associated with past childhood/family issues, resulting in less resentment and more serenity. 10. Resolve past childhood/family issues, leading to less anger and depression, greater self-esteem, security, and confidence. 11. Stabilize anxiety level while increasing ability to function on a daily basis. Objective Learn and implement relapse prevention strategies for managing possible future anxiety symptoms. Target Date: 2022-08-22 Frequency: Biweekly  Progress: 0 Modality: individual  12. Understand personal desires, insecurities, and anxieties that make overspending  possible.  Diagnosis:Major depressive disorder, recurrent episode, moderate ([facility])  Adjustment disorder with anxiety  Plan:  -meet again on Friday, March 02, 2022 at 8am  [place], [facility]

## 2022-02-13 ENCOUNTER — Other Ambulatory Visit: Payer: Self-pay | Admitting: Physician Assistant

## 2022-02-13 DIAGNOSIS — R519 Headache, unspecified: Secondary | ICD-10-CM

## 2022-02-23 ENCOUNTER — Encounter: Payer: Self-pay | Admitting: Family Medicine

## 2022-02-23 ENCOUNTER — Ambulatory Visit (INDEPENDENT_AMBULATORY_CARE_PROVIDER_SITE_OTHER): Payer: 59 | Admitting: Family Medicine

## 2022-02-23 VITALS — BP 122/77 | HR 93 | Ht 64.5 in | Wt 212.0 lb

## 2022-02-23 DIAGNOSIS — Z13220 Encounter for screening for lipoid disorders: Secondary | ICD-10-CM | POA: Diagnosis not present

## 2022-02-23 DIAGNOSIS — Z0000 Encounter for general adult medical examination without abnormal findings: Secondary | ICD-10-CM | POA: Diagnosis not present

## 2022-02-23 DIAGNOSIS — R42 Dizziness and giddiness: Secondary | ICD-10-CM

## 2022-02-23 DIAGNOSIS — Z23 Encounter for immunization: Secondary | ICD-10-CM | POA: Diagnosis not present

## 2022-02-23 DIAGNOSIS — J302 Other seasonal allergic rhinitis: Secondary | ICD-10-CM

## 2022-02-23 DIAGNOSIS — J309 Allergic rhinitis, unspecified: Secondary | ICD-10-CM

## 2022-02-23 DIAGNOSIS — E611 Iron deficiency: Secondary | ICD-10-CM

## 2022-02-23 DIAGNOSIS — R051 Acute cough: Secondary | ICD-10-CM

## 2022-02-23 DIAGNOSIS — H6993 Unspecified Eustachian tube disorder, bilateral: Secondary | ICD-10-CM

## 2022-02-23 MED ORDER — MONTELUKAST SODIUM 10 MG PO TABS: 10.0000 mg | ORAL_TABLET | Freq: Every day | ORAL | 3 refills | Status: DC

## 2022-02-23 NOTE — Patient Instructions (Signed)

## 2022-02-23 NOTE — Progress Notes (Signed)
April Wilkerson - 26 y.o. female MRN [id number]  Date of birth: 1995-06-15  Subjective Chief Complaint  Patient presents with   Annual Exam    HPI April Wilkerson is a 26 y.o. female here today for annual exam.   She reports that she is doing well.  Continues to see [facility] for counseling.  Doing pretty well with lexapro at current strength.   She is trying to staying active.  She feels that diet is improved from a few months ago.   She is up to date on Pap.   She would like to have flu vaccine today.   Review of Systems  Constitutional:  Negative for chills, fever, malaise/fatigue and weight loss.  HENT:  Negative for congestion, ear pain and sore throat.   Eyes:  Negative for blurred vision, double vision and pain.  Respiratory:  Negative for cough and shortness of breath.   Cardiovascular:  Negative for chest pain and palpitations.  Gastrointestinal:  Negative for abdominal pain, blood in stool, constipation, heartburn and nausea.  Genitourinary:  Negative for dysuria and urgency.  Musculoskeletal:  Negative for joint pain and myalgias.  Neurological:  Negative for dizziness and headaches.  Endo/Heme/Allergies:  Does not bruise/bleed easily.  Psychiatric/Behavioral:  Negative for depression. The patient is not nervous/anxious and does not have insomnia.     No Known Allergies  Past Medical History:  Diagnosis Date   Anemia    Asthma, chronic 10/13/2013   Palpitations    Vitamin D deficiency     History reviewed. No pertinent surgical history.  Social History   Socioeconomic History   Marital status: Single    Spouse name: Not on file   Number of children: Not on file   Years of education: Not on file   Highest education level: Not on file  Occupational History   Occupation: student    Comment: studying healthcare management at [employer]   Tobacco Use   Smoking status: Never   Smokeless tobacco: Never  Vaping Use   Vaping Use: Never used   Substance and Sexual Activity   Alcohol use: No   Drug use: Never   Sexual activity: Yes    Birth control/protection: None    Comment: none needed at the moment   Other Topics Concern   Not on file  Social History Narrative   ** Merged History Encounter **       Social Determinants of Health   Financial Resource Strain: Not on file  Food Insecurity: Not on file  Transportation Needs: Not on file  Physical Activity: Not on file  Stress: Not on file  Social Connections: Not on file    Family History  Problem Relation Age of Onset   Cancer Mother        breast cancer, not sure what age diagnosed    Hypertension Father     Health Maintenance  Topic Date Due   COVID-19 Vaccine (4 - 2023-24 season) 04/19/2022 (Originally 11/17/2021)   Hepatitis C Screening  12/07/2022 (Originally 08/03/2013)   HIV Screening  12/07/2022 (Originally 08/04/2010)   PAP-Cervical Cytology Screening  11/08/2023   PAP SMEAR-Modifier  11/08/2023   DTaP/Tdap/Td (7 - Td or Tdap) 05/13/2028   INFLUENZA VACCINE  Completed   HPV VACCINES  Completed     ----------------------------------------------------------------------------------------------------------------------------------------------------------------------------------------------------------------- Physical Exam BP 122/77 (BP Location: Right Arm, Patient Position: Sitting, Cuff Size: Large)   Pulse 93   Ht 5' 4.5" (1.638 m)   Wt 212 lb (  96.2 kg)   SpO2 100%   BMI 35.83 kg/m   Physical Exam Constitutional:      General: She is not in acute distress. HENT:     Head: Normocephalic and atraumatic.     Right Ear: Tympanic membrane and ear canal normal.     Left Ear: Tympanic membrane and ear canal normal.     Nose: Nose normal.  Eyes:     General: No scleral icterus.    Conjunctiva/sclera: Conjunctivae normal.  Neck:     Thyroid: No thyromegaly.  Cardiovascular:     Rate and Rhythm: Normal rate and regular rhythm.     Heart sounds:  Normal heart sounds.  Pulmonary:     Effort: Pulmonary effort is normal.     Breath sounds: Normal breath sounds.  Abdominal:     General: Bowel sounds are normal. There is no distension.     Palpations: Abdomen is soft.     Tenderness: There is no abdominal tenderness. There is no guarding.  Musculoskeletal:        General: Normal range of motion.     Cervical back: Normal range of motion and neck supple.  Lymphadenopathy:     Cervical: No cervical adenopathy.  Skin:    General: Skin is warm and dry.     Findings: No rash.  Neurological:     General: No focal deficit present.     Mental Status: She is alert and oriented to person, place, and time.     Cranial Nerves: No cranial nerve deficit.     Coordination: Coordination normal.  Psychiatric:        Mood and Affect: Mood normal.        Behavior: Behavior normal.     ------------------------------------------------------------------------------------------------------------------------------------------------------------------------------------------------------------------- Assessment and Plan  Well adult exam Well adult Orders Placed This Encounter  Procedures   Flu Vaccine QUAD 6+ mos PF IM (Fluarix Quad PF)   COMPLETE METABOLIC PANEL WITH GFR   CBC with Differential   Lipid Panel w/reflex Direct LDL   Iron, TIBC and Ferritin Panel  Screening: Per lab orders Immunizations: Flu vaccine given today Anticipatory guidance/Risk factor reduction:  Recommendations per AVS.    No orders of the defined types were placed in this encounter.   No follow-ups on file.    This visit occurred during the SARS-CoV-2 public health emergency.  Safety protocols were in place, including screening questions prior to the visit, additional usage of staff PPE, and extensive cleaning of exam room while observing appropriate contact time as indicated for disinfecting solutions.

## 2022-02-23 NOTE — Assessment & Plan Note (Signed)
Well adult Orders Placed This Encounter  Procedures   Flu Vaccine QUAD 6+ mos PF IM (Fluarix Quad PF)   COMPLETE METABOLIC PANEL WITH GFR   CBC with Differential   Lipid Panel w/reflex Direct LDL   Iron, TIBC and Ferritin Panel  Screening: Per lab orders Immunizations: Flu vaccine given today Anticipatory guidance/Risk factor reduction:  Recommendations per AVS.

## 2022-02-24 LAB — LIPID PANEL W/REFLEX DIRECT LDL
Cholesterol: 176 mg/dL (ref <200)
HDL: 58 mg/dL (ref >=50)
LDL Cholesterol (Calc): 102 mg/dL (calc) — ABNORMAL HIGH
Non-HDL Cholesterol (Calc): 118 mg/dL (calc) (ref <130)
Total CHOL/HDL Ratio: 3 (calc) (ref <5.0)
Triglycerides: 72 mg/dL (ref <150)

## 2022-02-24 LAB — COMPLETE METABOLIC PANEL WITH GFR
AG Ratio: 1.3 (calc) (ref 1.0–2.5)
ALT: 10 U/L (ref 6–29)
AST: 15 U/L (ref 10–30)
Albumin: 4.4 g/dL (ref 3.6–5.1)
Alkaline phosphatase (APISO): 76 U/L (ref 31–125)
BUN: 12 mg/dL (ref 7–25)
CO2: 28 mmol/L (ref 20–32)
Calcium: 9.6 mg/dL (ref 8.6–10.2)
Chloride: 103 mmol/L (ref 98–110)
Creat: 0.68 mg/dL (ref 0.50–0.96)
Globulin: 3.5 g/dL (calc) (ref 1.9–3.7)
Glucose, Bld: 87 mg/dL (ref 65–99)
Potassium: 5 mmol/L (ref 3.5–5.3)
Sodium: 138 mmol/L (ref 135–146)
Total Bilirubin: 0.4 mg/dL (ref 0.2–1.2)
Total Protein: 7.9 g/dL (ref 6.1–8.1)
eGFR: 123 mL/min/{1.73_m2} (ref >=60)

## 2022-02-24 LAB — CBC WITH DIFFERENTIAL/PLATELET
Absolute Monocytes: 478 cells/uL (ref 200–950)
Basophils Absolute: 31 cells/uL (ref 0–200)
Basophils Relative: 0.6 %
Eosinophils Absolute: 62 cells/uL (ref 15–500)
Eosinophils Relative: 1.2 %
HCT: 37.5 % (ref 35.0–45.0)
Hemoglobin: 12.4 g/dL (ref 11.7–15.5)
Lymphs Abs: 1945 cells/uL (ref 850–3900)
MCH: 27.6 pg (ref 27.0–33.0)
MCHC: 33.1 g/dL (ref 32.0–36.0)
MCV: 83.5 fL (ref 80.0–100.0)
MPV: 10.9 fL (ref 7.5–12.5)
Monocytes Relative: 9.2 %
Neutro Abs: 2683 cells/uL (ref 1500–7800)
Neutrophils Relative %: 51.6 %
Platelets: 333 10*3/uL (ref 140–400)
RBC: 4.49 10*6/uL (ref 3.80–5.10)
RDW: 13.2 % (ref 11.0–15.0)
Total Lymphocyte: 37.4 %
WBC: 5.2 10*3/uL (ref 3.8–10.8)

## 2022-02-24 LAB — IRON,TIBC AND FERRITIN PANEL
%SAT: 11 % (calc) — ABNORMAL LOW (ref 16–45)
Ferritin: 6 ng/mL — ABNORMAL LOW (ref 16–154)
Iron: 50 ug/dL (ref 40–190)
TIBC: 435 mcg/dL (calc) (ref 250–450)

## 2022-03-02 ENCOUNTER — Ambulatory Visit (INDEPENDENT_AMBULATORY_CARE_PROVIDER_SITE_OTHER): Payer: 59 | Admitting: Professional

## 2022-03-02 ENCOUNTER — Encounter: Payer: Self-pay | Admitting: Professional

## 2022-03-02 DIAGNOSIS — F4322 Adjustment disorder with anxiety: Secondary | ICD-10-CM | POA: Diagnosis not present

## 2022-03-02 DIAGNOSIS — F331 Major depressive disorder, recurrent, moderate: Secondary | ICD-10-CM
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
## 2022-03-14 ENCOUNTER — Telehealth: Payer: Self-pay

## 2022-03-14 NOTE — Telephone Encounter (Signed)
Pt lvm stating Covid +. Attempted to contact the patient for virtual scheduling. No answer.   Sent MyChart message advising Virtual visit availability.

## 2022-03-15 ENCOUNTER — Telehealth: Payer: 59 | Admitting: Physician Assistant

## 2022-03-15 DIAGNOSIS — U071 COVID-19: Secondary | ICD-10-CM | POA: Diagnosis not present

## 2022-03-15 MED ORDER — BENZONATATE 100 MG PO CAPS: 100.0000 mg | ORAL_CAPSULE | Freq: Three times a day (TID) | ORAL | 0 refills | Status: DC | PRN

## 2022-03-15 MED ORDER — NIRMATRELVIR/RITONAVIR (PAXLOVID)TABLET: 3.0000 | ORAL_TABLET | Freq: Two times a day (BID) | ORAL | 0 refills | Status: AC

## 2022-03-15 NOTE — Patient Instructions (Signed)
April Wilkerson, thank you for joining Leeanne Rio, PA-C for today's virtual visit.  While this provider is not your primary care provider (PCP), if your PCP is located in our provider database this encounter information will be shared with them immediately following your visit.   [facility] account gives you access to today's visit and all your visits, tests, and labs performed at [facility] " click here if you don't have a [facility] account or go to [email or web address]  Consent: (Patient) April Wilkerson provided verbal consent for this virtual visit at the beginning of the encounter.  Current Medications:  Current Outpatient Medications:    albuterol (VENTOLIN HFA) 108 (90 Base) MCG/ACT inhaler, Inhale 1-2 puffs into the lungs every 4 (four) hours as needed for wheezing or shortness of breath., Disp: 18 g, Rfl: 1   aspirin-acetaminophen-caffeine (EXCEDRIN MIGRAINE) 250-250-65 MG tablet, Take 1-2 tablets by mouth every 6 (six) hours as needed for headache., Disp: 30 tablet, Rfl: 0   escitalopram (LEXAPRO) 20 MG tablet, Take 1 tablet (20 mg total) by mouth daily., Disp: 90 tablet, Rfl: 1   fluticasone (FLONASE) 50 MCG/ACT nasal spray, SHAKE LIQUID AND USE 2 SPRAYS IN EACH NOSTRIL DAILY, Disp: 16 g, Rfl: 0   hydrOXYzine (VISTARIL) 25 MG capsule, Take 1 capsule (25 mg total) by mouth 3 (three) times daily as needed for anxiety., Disp: 90 capsule, Rfl: 1   montelukast (SINGULAIR) 10 MG tablet, Take 1 tablet (10 mg total) by mouth at bedtime., Disp: 90 tablet, Rfl: 3   MULTIPLE VITAMIN PO, Take 1 tablet by mouth daily., Disp: , Rfl:    Probiotic Product (PROBIOTIC-10 PO), Take by mouth., Disp: , Rfl:    Medications ordered in this encounter:  No orders of the defined types were placed in this encounter.    *If you need refills on other medications prior to your next appointment, please contact your pharmacy*  Follow-Up: Call back  or seek an in-person evaluation if the symptoms worsen or if the condition fails to improve as anticipated.  [place] [phone]  Other Instructions Please keep well-hydrated and get plenty of rest. Start a saline nasal rinse to flush out your nasal passages. You can use plain Mucinex to help thin congestion. If you have a humidifier, running in the bedroom at night. I want you to start OTC vitamin D3 1000 units daily, vitamin C 1000 mg daily, and a zinc supplement. Please take prescribed medications as directed.  You have been enrolled in a MyChart symptom monitoring program. Please answer these questions daily so we can keep track of how you are doing.  You were to quarantine for 5 days from onset of your symptoms.  After day 5, if you have had no fever and you are feeling better, you can end quarantine but need to mask for an additional 5 days. After day 5 if you have a fever or are having significant symptoms, please quarantine for full 10 days.  If you note any worsening of symptoms, any significant shortness of breath or any chest pain, please seek ER evaluation ASAP.  Please do not delay care!  COVID-19: What to Do if You Are Sick If you test positive and are an older adult or someone who is at high risk of getting very sick from COVID-19, treatment may be available. Contact a healthcare provider right away after a positive test to determine if you are eligible, even if your symptoms  are mild right now. You can also visit a Test to Treat location and, if eligible, receive a prescription from a provider. Don't delay: Treatment must be started within the first few days to be effective. If you have a fever, cough, or other symptoms, you might have COVID-19. Most people have mild illness and are able to recover at home. If you are sick: Keep track of your symptoms. If you have an emergency warning sign (including trouble breathing), call 911. Steps to help prevent the  spread of COVID-19 if you are sick If you are sick with COVID-19 or think you might have COVID-19, follow the steps below to care for yourself and to help protect other people in your home and community. Stay home except to get medical care Stay home. Most people with COVID-19 have mild illness and can recover at home without medical care. Do not leave your home, except to get medical care. Do not visit public areas and do not go to places where you are unable to wear a mask. Take care of yourself. Get rest and stay hydrated. Take over-the-counter medicines, such as acetaminophen, to help you feel better. Stay in touch with your doctor. Call before you get medical care. Be sure to get care if you have trouble breathing, or have any other emergency warning signs, or if you think it is an emergency. Avoid public transportation, ride-sharing, or taxis if possible. Get tested If you have symptoms of COVID-19, get tested. While waiting for test results, stay away from others, including staying apart from those living in your household. Get tested as soon as possible after your symptoms start. Treatments may be available for people with COVID-19 who are at risk for becoming very sick. Don't delay: Treatment must be started early to be effective--some treatments must begin within 5 days of your first symptoms. Contact your healthcare provider right away if your test result is positive to determine if you are eligible. Self-tests are one of several options for testing for the virus that causes COVID-19 and may be more convenient than laboratory-based tests and point-of-care tests. Ask your healthcare provider or your local health department if you need help interpreting your test results. You can visit your state, tribal, local, and territorial health department's website to look for the latest local information on testing sites. Separate yourself from other people As much as possible, stay in a specific room  and away from other people and pets in your home. If possible, you should use a separate bathroom. If you need to be around other people or animals in or outside of the home, wear a well-fitting mask. Tell your close contacts that they may have been exposed to COVID-19. An infected person can spread COVID-19 starting 48 hours (or 2 days) before the person has any symptoms or tests positive. By letting your close contacts know they may have been exposed to COVID-19, you are helping to protect everyone. See COVID-19 and Animals if you have questions about pets. If you are diagnosed with COVID-19, someone from the health department may call you. Answer the call to slow the spread. Monitor your symptoms Symptoms of COVID-19 include fever, cough, or other symptoms. Follow care instructions from your healthcare provider and local health department. Your local health authorities may give instructions on checking your symptoms and reporting information. When to seek emergency medical attention Look for emergency warning signs* for COVID-19. If someone is showing any of these signs, seek emergency medical care immediately: Trouble  breathing Persistent pain or pressure in the chest New confusion Inability to wake or stay awake Pale, gray, or blue-colored skin, lips, or nail beds, depending on skin tone *This list is not all possible symptoms. Please call your medical provider for any other symptoms that are severe or concerning to you. Call 911 or call ahead to your local emergency facility: Notify the operator that you are seeking care for someone who has or may have COVID-19. Call ahead before visiting your doctor Call ahead. Many medical visits for routine care are being postponed or done by phone or telemedicine. If you have a medical appointment that cannot be postponed, call your doctor's office, and tell them you have or may have COVID-19. This will help the office protect themselves and other  patients. If you are sick, wear a well-fitting mask You should wear a mask if you must be around other people or animals, including pets (even at home). Wear a mask with the best fit, protection, and comfort for you. You don't need to wear the mask if you are alone. If you can't put on a mask (because of trouble breathing, for example), cover your coughs and sneezes in some other way. Try to stay at least 6 feet away from other people. This will help protect the people around you. Masks should not be placed on young children under age 46 years, anyone who has trouble breathing, or anyone who is not able to remove the mask without help. Cover your coughs and sneezes Cover your mouth and nose with a tissue when you cough or sneeze. Throw away used tissues in a lined trash can. Immediately wash your hands with soap and water for at least 20 seconds. If soap and water are not available, clean your hands with an alcohol-based hand sanitizer that contains at least 60% alcohol. Clean your hands often Wash your hands often with soap and water for at least 20 seconds. This is especially important after blowing your nose, coughing, or sneezing; going to the bathroom; and before eating or preparing food. Use hand sanitizer if soap and water are not available. Use an alcohol-based hand sanitizer with at least 60% alcohol, covering all surfaces of your hands and rubbing them together until they feel dry. Soap and water are the best option, especially if hands are visibly dirty. Avoid touching your eyes, nose, and mouth with unwashed hands. Handwashing Tips Avoid sharing personal household items Do not share dishes, drinking glasses, cups, eating utensils, towels, or bedding with other people in your home. Wash these items thoroughly after using them with soap and water or put in the dishwasher. Clean surfaces in your home regularly Clean and disinfect high-touch surfaces (for example, doorknobs, tables,  handles, light switches, and countertops) in your "sick room" and bathroom. In shared spaces, you should clean and disinfect surfaces and items after each use by the person who is ill. If you are sick and cannot clean, a caregiver or other person should only clean and disinfect the area around you (such as your bedroom and bathroom) on an as needed basis. Your caregiver/other person should wait as long as possible (at least several hours) and wear a mask before entering, cleaning, and disinfecting shared spaces that you use. Clean and disinfect areas that may have blood, stool, or body fluids on them. Use household cleaners and disinfectants. Clean visible dirty surfaces with household cleaners containing soap or detergent. Then, use a household disinfectant. Use a product from [employer]:  Disinfectants for Coronavirus (COVID-19). Be sure to follow the instructions on the label to ensure safe and effective use of the product. Many products recommend keeping the surface wet with a disinfectant for a certain period of time (look at "contact time" on the product label). You may also need to wear personal protective equipment, such as gloves, depending on the directions on the product label. Immediately after disinfecting, wash your hands with soap and water for 20 seconds. For completed guidance on cleaning and disinfecting your home, visit Complete Disinfection Guidance. Take steps to improve ventilation at home Improve ventilation (air flow) at home to help prevent from spreading COVID-19 to other people in your household. Clear out COVID-19 virus particles in the air by opening windows, using air filters, and turning on fans in your home. Use this interactive tool to learn how to improve air flow in your home. When you can be around others after being sick with COVID-19 Deciding when you can be around others is different for different situations. Find out when you can safely end home isolation. For  any additional questions about your care, contact your healthcare provider or state or local health department. 06/07/2020 Content source: [facility] for Immunization and Respiratory Diseases ([facility]), Division of Viral Diseases This information is not intended to replace advice given to you by your health care provider. Make sure you discuss any questions you have with your health care provider. Document Revised: 07/21/2020 Document Reviewed: 07/21/2020 Elsevier Patient Education  2022 [employer].      If you have been instructed to have an in-person evaluation today at a local Urgent Care facility, please use the link below. It will take you to a list of all of our available [facility], including address, phone number and hours of operation. Please do not delay care.  [facility]  If you or a family member do not have a primary care provider, use the link below to schedule a visit and establish care. When you choose a [facility] primary care physician or advanced practice provider, you gain a long-term partner in health. Find a Primary Care Provider  Learn more about [facility]'s in-office and virtual care options: [facility]

## 2022-03-15 NOTE — Progress Notes (Signed)
Virtual Visit Consent   April Wilkerson, you are scheduled for a virtual visit with a [facility] provider today. Just as with appointments in the office, your consent must be obtained to participate. Your consent will be active for this visit and any virtual visit you may have with one of our providers in the next 365 days. If you have a MyChart account, a copy of this consent can be sent to you electronically.  As this is a virtual visit, video technology does not allow for your provider to perform a traditional examination. This may limit your provider's ability to fully assess your condition. If your provider identifies any concerns that need to be evaluated in person or the need to arrange testing (such as labs, EKG, etc.), we will make arrangements to do so. Although advances in technology are sophisticated, we cannot ensure that it will always work on either your end or our end. If the connection with a video visit is poor, the visit may have to be switched to a telephone visit. With either a video or telephone visit, we are not always able to ensure that we have a secure connection.  By engaging in this virtual visit, you consent to the provision of healthcare and authorize for your insurance to be billed (if applicable) for the services provided during this visit. Depending on your insurance coverage, you may receive a charge related to this service.  I need to obtain your verbal consent now. Are you willing to proceed with your visit today? April Wilkerson has provided verbal consent on 03/15/2022 for a virtual visit (video or telephone). Piedad Climes, [place]  Date: 03/15/2022 7:43 PM  Virtual Visit via Video Note   I, Piedad Climes, connected with  April Wilkerson  ([id number], Dec 19, 1995) on 03/15/22 at  7:45 PM EST by a video-enabled telemedicine application and verified that I am speaking with the correct person using two identifiers.  Location: Patient:  Virtual Visit Location Patient: Home Provider: Virtual Visit Location Provider: Home Office   I discussed the limitations of evaluation and management by telemedicine and the availability of in person appointments. The patient expressed understanding and agreed to proceed.    History of Present Illness: April Wilkerson is a 26 y.o. who identifies as a female who was assigned female at birth, and is being seen today for COVID-19. Notes symptoms really starting Saturday with sore throat, fatigue, aches, increased congestion and cough. Some chest tightness. Took home COVID test which was positive. Has been taking OTC Mucinex Sinus, Flonase, Singulair and her albuterol. Symptoms are still significant. Thankfully no chest pain. Was wanting to discuss if there is anything else that can be considered for treatment.  HPI: HPI  Problems:  Patient Active Problem List   Diagnosis Date Noted   Well adult exam 02/23/2022   Major depressive disorder, recurrent episode, moderate (HCC) 11/08/2021   Adjustment disorder with mixed anxiety and depressed mood 08/12/2021   Allergic sinusitis 08/08/2021   Acute cough 08/08/2021   Frequent headaches 04/26/2021   Dizziness 04/26/2021   Neck tightness 04/26/2021   ETD (Eustachian tube dysfunction), bilateral 04/26/2021   Anxiety state 12/12/2020   Constipation 10/28/2014   Anemia 10/15/2014   Hypokalemia 10/15/2014   Family history of breast cancer 10/14/2014   Acne 02/15/2014   Chronic asthma 10/13/2013   Faintness 10/13/2013    Allergies: No Known Allergies Medications:  Current Outpatient Medications:    nirmatrelvir/ritonavir (PAXLOVID) 20 x 150 MG &  10 x 100MG  TABS, Take 3 tablets by mouth 2 (two) times daily for 5 days. (Take nirmatrelvir 150 mg two tablets twice daily for 5 days and ritonavir 100 mg one tablet twice daily for 5 days) Patient GFR is > 60, Disp: 30 tablet, Rfl: 0   albuterol (VENTOLIN HFA) 108 (90 Base) MCG/ACT inhaler, Inhale  1-2 puffs into the lungs every 4 (four) hours as needed for wheezing or shortness of breath., Disp: 18 g, Rfl: 1   aspirin-acetaminophen-caffeine (EXCEDRIN MIGRAINE) 250-250-65 MG tablet, Take 1-2 tablets by mouth every 6 (six) hours as needed for headache., Disp: 30 tablet, Rfl: 0   escitalopram (LEXAPRO) 20 MG tablet, Take 1 tablet (20 mg total) by mouth daily., Disp: 90 tablet, Rfl: 1   fluticasone (FLONASE) 50 MCG/ACT nasal spray, SHAKE LIQUID AND USE 2 SPRAYS IN EACH NOSTRIL DAILY, Disp: 16 g, Rfl: 0   hydrOXYzine (VISTARIL) 25 MG capsule, Take 1 capsule (25 mg total) by mouth 3 (three) times daily as needed for anxiety., Disp: 90 capsule, Rfl: 1   montelukast (SINGULAIR) 10 MG tablet, Take 1 tablet (10 mg total) by mouth at bedtime., Disp: 90 tablet, Rfl: 3   MULTIPLE VITAMIN PO, Take 1 tablet by mouth daily., Disp: , Rfl:    Probiotic Product (PROBIOTIC-10 PO), Take by mouth., Disp: , Rfl:   Observations/Objective: Patient is well-developed, well-nourished in no acute distress.  Resting comfortably at home.  Head is normocephalic, atraumatic.  No labored breathing. Speech is clear and coherent with logical content.  Patient is alert and oriented at baseline.   Assessment and Plan: 1. COVID-19 - MyChart COVID-19 home monitoring program; Future - nirmatrelvir/ritonavir (PAXLOVID) 20 x 150 MG & 10 x 100MG  TABS; Take 3 tablets by mouth 2 (two) times daily for 5 days. (Take nirmatrelvir 150 mg two tablets twice daily for 5 days and ritonavir 100 mg one tablet twice daily for 5 days) Patient GFR is > 60  Dispense: 30 tablet; Refill: 0  Patient with multiple risk factors for complicated course of illness. Discussed risks/benefits of antiviral medications including most common potential ADRs. Patient voiced understanding and would like to proceed with antiviral medication. They are candidate for Paxlovid and are at the tail end of the antiviral window. Rx sent to pharmacy. Supportive measures,  OTC medications and vitamin regimen reviewed. Tessalon per orders. Continue asthma/allergy medications. Patient has been enrolled in a MyChart COVID symptom monitoring program. reviewed in detail. Strict ER precautions discussed with patient.    Follow Up Instructions: I discussed the assessment and treatment plan with the patient. The patient was provided an opportunity to ask questions and all were answered. The patient agreed with the plan and demonstrated an understanding of the instructions.  A copy of instructions were sent to the patient via MyChart unless otherwise noted below.   The patient was advised to call back or seek an in-person evaluation if the symptoms worsen or if the condition fails to improve as anticipated.  Time:  I spent 10 minutes with the patient via telehealth technology discussing the above problems/concerns.    , PA-C

## 2022-03-20 ENCOUNTER — Telehealth: Payer: Self-pay

## 2022-03-20 NOTE — Telephone Encounter (Signed)
Called patient in regard to COVID symptoms. NO answer, left VM to call community line.

## 2022-03-21 ENCOUNTER — Telehealth: Payer: Self-pay

## 2022-03-21 NOTE — Telephone Encounter (Signed)
Per after hours nurse - patient called with concerns of coughing with no relief while taking her antibiotic rx on . Patient states having asthma exacerbation with slight wheezing. Patient states inhaler is not providing her relief as well. Attempted to contact patient to schedule an appointment for a follow up on symptoms. No answer left a detailed vm msg with direct call back info provided.   S/N  The on-call nurse did recommend that patient should contact provider within 24 hrs for an evaluation/appointment.   A CMA from [facility] tried contacting the patient as well with no response on 03/20/22.

## 2022-03-23 ENCOUNTER — Encounter: Payer: Self-pay | Admitting: Physician Assistant

## 2022-03-23 ENCOUNTER — Ambulatory Visit (INDEPENDENT_AMBULATORY_CARE_PROVIDER_SITE_OTHER): Payer: 59 | Admitting: Physician Assistant

## 2022-03-23 VITALS — BP 129/82 | HR 81 | Ht 67.0 in | Wt 213.1 lb

## 2022-03-23 DIAGNOSIS — U071 COVID-19: Secondary | ICD-10-CM

## 2022-03-23 DIAGNOSIS — J40 Bronchitis, not specified as acute or chronic: Secondary | ICD-10-CM | POA: Diagnosis not present

## 2022-03-23 DIAGNOSIS — J329 Chronic sinusitis, unspecified: Secondary | ICD-10-CM

## 2022-03-23 DIAGNOSIS — J4520 Mild intermittent asthma, uncomplicated: Secondary | ICD-10-CM

## 2022-03-23 MED ORDER — DEXAMETHASONE 4 MG PO TABS: 4.0000 mg | ORAL_TABLET | Freq: Two times a day (BID) | ORAL | 0 refills | Status: DC

## 2022-03-23 MED ORDER — AZITHROMYCIN 250 MG PO TABS: ORAL_TABLET | ORAL | 0 refills | Status: DC

## 2022-03-23 NOTE — Progress Notes (Signed)
Acute Office Visit  Subjective:     Patient ID: April Wilkerson, female    DOB: 1995-07-14, 27 y.o.   MRN: [id number]  Chief Complaint  Patient presents with   chest congestion   Chest Pain   Shortness of Breath    HPI Patient is in today for cough, fatigue, shortness of breath post covid infection before christmas. Pt does have history of asthma and allergies. She was seen on video visit 03/15/2022. She was given paxlovid and tessalon pearls. She has had some sinus improvement but no lung improvement. Cough is productive to dry. She is easily out of breath. Albuterol inhaler helps and using 3 times a day on average. No lower leg edema or pain. No fever, chills or body aches.   .. Active Ambulatory Problems    Diagnosis Date Noted   Chronic asthma 10/13/2013   Faintness 10/13/2013   Acne 02/15/2014   Family history of breast cancer 10/14/2014   Anemia 10/15/2014   Hypokalemia 10/15/2014   Constipation 10/28/2014   Anxiety state 12/12/2020   Frequent headaches 04/26/2021   Dizziness 04/26/2021   Neck tightness 04/26/2021   ETD (Eustachian tube dysfunction), bilateral 04/26/2021   Allergic sinusitis 08/08/2021   Acute cough 08/08/2021   Adjustment disorder with mixed anxiety and depressed mood 08/12/2021   Major depressive disorder, recurrent episode, moderate ([place]) 11/08/2021   Well adult exam 02/23/2022   Resolved Ambulatory Problems    Diagnosis Date Noted   No Resolved Ambulatory Problems   Past Medical History:  Diagnosis Date   Asthma, chronic 10/13/2013   Palpitations    Vitamin D deficiency      ROS  See HPI.     Objective:    BP 129/82 (BP Location: Right Arm, Patient Position: Sitting, Cuff Size: Large)   Pulse 81   Ht 5\' 7"  (1.702 m)   Wt 213 lb 2 oz (96.7 kg)   SpO2 100%   BMI 33.38 kg/m  BP Readings from Last 3 Encounters:  03/23/22 129/82  02/23/22 122/77  08/07/21 127/81   Wt Readings from Last 3 Encounters:  03/23/22 213 lb 2  oz (96.7 kg)  02/23/22 212 lb (96.2 kg)  11/09/21 208 lb (94.3 kg)      Physical Exam Constitutional:      Appearance: Normal appearance. She is obese.  HENT:     Head: Normocephalic.     Right Ear: Tympanic membrane, ear canal and external ear normal. There is no impacted cerumen.     Left Ear: Tympanic membrane and ear canal normal. There is no impacted cerumen.     Nose: Nose normal.     Mouth/Throat:     Mouth: Mucous membranes are moist.     Pharynx: Posterior oropharyngeal erythema present. No oropharyngeal exudate.  Eyes:     General:        Right eye: No discharge.        Left eye: No discharge.     Conjunctiva/sclera: Conjunctivae normal.  Cardiovascular:     Rate and Rhythm: Normal rate and regular rhythm.  Pulmonary:     Effort: Pulmonary effort is normal.     Breath sounds: Normal breath sounds.  Musculoskeletal:        General: No swelling or tenderness.     Right lower leg: No edema.     Left lower leg: No edema.  Neurological:     General: No focal deficit present.     Mental Status: She is  alert and oriented to person, place, and time.  Psychiatric:        Mood and Affect: Mood normal.          Assessment & Plan:  [name] KitchenMarland KitchenMiracle was seen today for chest congestion, chest pain and shortness of breath.  Diagnoses and all orders for this visit:  COVID-19 -     dexamethasone (DECADRON) 4 MG tablet; Take 1 tablet (4 mg total) by mouth 2 (two) times daily with a meal.  Sinobronchitis -     dexamethasone (DECADRON) 4 MG tablet; Take 1 tablet (4 mg total) by mouth 2 (two) times daily with a meal. -     azithromycin (ZITHROMAX Z-PAK) 250 MG tablet; Take 2 tablets (500 mg) on  Day 1,  followed by 1 tablet (250 mg) once daily on Days 2 through 5.  Mild intermittent chronic asthma without complication -     dexamethasone (DECADRON) 4 MG tablet; Take 1 tablet (4 mg total) by mouth 2 (two) times daily with a meal.   Vitals look good Know covid and hx of  asthma Continue using albuterol inhaler as needed every 2-4 hours Start decadron Hold zpak for another 2 days and see if symptoms improving No red flags for blood clots or covid complications  Iran Planas, PA-C

## 2022-03-23 NOTE — Patient Instructions (Signed)
Start dexamethasone Zpak if not improving

## 2022-03-26 ENCOUNTER — Telehealth: Payer: Self-pay | Admitting: Family Medicine

## 2022-03-26 NOTE — Telephone Encounter (Signed)
Called patient, LVM for her to call back if she needs anything further.

## 2022-03-26 NOTE — Telephone Encounter (Signed)
Patient called left vm would like a call back in regards to medication prescribed on 03-23-22 one medication she couldn't get didn't state which one it was

## 2022-03-26 NOTE — Telephone Encounter (Signed)
I got a call this weekend and resent medications in for patient. Make sure this is what this call was regarding.

## 2022-03-30 ENCOUNTER — Telehealth: Payer: Self-pay

## 2022-03-30 ENCOUNTER — Other Ambulatory Visit: Payer: Self-pay

## 2022-03-30 ENCOUNTER — Ambulatory Visit: Payer: Self-pay | Admitting: Professional

## 2022-03-30 DIAGNOSIS — R519 Headache, unspecified: Secondary | ICD-10-CM

## 2022-03-30 DIAGNOSIS — R42 Dizziness and giddiness: Secondary | ICD-10-CM

## 2022-03-30 DIAGNOSIS — J309 Allergic rhinitis, unspecified: Secondary | ICD-10-CM

## 2022-03-30 DIAGNOSIS — J302 Other seasonal allergic rhinitis: Secondary | ICD-10-CM

## 2022-03-30 DIAGNOSIS — R051 Acute cough: Secondary | ICD-10-CM

## 2022-03-30 DIAGNOSIS — H6993 Unspecified Eustachian tube disorder, bilateral: Secondary | ICD-10-CM

## 2022-03-30 MED ORDER — ESCITALOPRAM OXALATE 20 MG PO TABS: 20.0000 mg | ORAL_TABLET | Freq: Every day | ORAL | 0 refills | Status: DC

## 2022-03-30 MED ORDER — FLUTICASONE PROPIONATE 50 MCG/ACT NA SUSP: NASAL | 0 refills | Status: AC

## 2022-03-30 MED ORDER — MONTELUKAST SODIUM 10 MG PO TABS: 10.0000 mg | ORAL_TABLET | Freq: Every day | ORAL | 1 refills | Status: DC

## 2022-03-30 MED ORDER — HYDROXYZINE PAMOATE 25 MG PO CAPS: 25.0000 mg | ORAL_CAPSULE | Freq: Three times a day (TID) | ORAL | 0 refills | Status: DC | PRN

## 2022-04-02 NOTE — Telephone Encounter (Signed)
Medications have been sent

## 2022-04-03 NOTE — Telephone Encounter (Signed)
Called and lvm for patient to return phone call if any questions or concerns.

## 2022-04-23 ENCOUNTER — Ambulatory Visit: Payer: Self-pay | Admitting: Professional

## 2022-05-29 ENCOUNTER — Ambulatory Visit (INDEPENDENT_AMBULATORY_CARE_PROVIDER_SITE_OTHER): Payer: 59 | Admitting: Professional

## 2022-05-29 ENCOUNTER — Encounter: Payer: Self-pay | Admitting: Professional

## 2022-05-29 DIAGNOSIS — F331 Major depressive disorder, recurrent, moderate: Secondary | ICD-10-CM | POA: Diagnosis not present

## 2022-05-29 DIAGNOSIS — F4322 Adjustment disorder with anxiety: Secondary | ICD-10-CM | POA: Diagnosis not present
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
## 2022-05-31 ENCOUNTER — Encounter: Payer: Self-pay | Admitting: Professional

## 2022-05-31 ENCOUNTER — Ambulatory Visit (INDEPENDENT_AMBULATORY_CARE_PROVIDER_SITE_OTHER): Payer: 59 | Admitting: Professional

## 2022-05-31 DIAGNOSIS — F331 Major depressive disorder, recurrent, moderate: Secondary | ICD-10-CM

## 2022-05-31 DIAGNOSIS — F4322 Adjustment disorder with anxiety: Secondary | ICD-10-CM | POA: Diagnosis not present
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
## 2022-06-27 ENCOUNTER — Encounter: Payer: Self-pay | Admitting: Professional

## 2022-06-27 ENCOUNTER — Ambulatory Visit (INDEPENDENT_AMBULATORY_CARE_PROVIDER_SITE_OTHER): Payer: 59 | Admitting: Professional

## 2022-06-27 DIAGNOSIS — F331 Major depressive disorder, recurrent, moderate: Secondary | ICD-10-CM

## 2022-06-27 DIAGNOSIS — F4322 Adjustment disorder with anxiety: Secondary | ICD-10-CM
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
## 2022-07-24 ENCOUNTER — Ambulatory Visit (INDEPENDENT_AMBULATORY_CARE_PROVIDER_SITE_OTHER): Payer: 59 | Admitting: Professional

## 2022-07-24 ENCOUNTER — Encounter: Payer: Self-pay | Admitting: Professional

## 2022-07-24 DIAGNOSIS — F4322 Adjustment disorder with anxiety: Secondary | ICD-10-CM

## 2022-07-24 DIAGNOSIS — F331 Major depressive disorder, recurrent, moderate: Secondary | ICD-10-CM | POA: Diagnosis not present
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
## 2022-08-01 ENCOUNTER — Encounter: Payer: Self-pay | Admitting: Family Medicine

## 2022-08-01 ENCOUNTER — Ambulatory Visit: Payer: 59 | Admitting: Family Medicine

## 2022-08-01 VITALS — BP 121/80 | HR 82 | Ht 67.0 in | Wt 232.0 lb

## 2022-08-01 DIAGNOSIS — F331 Major depressive disorder, recurrent, moderate: Secondary | ICD-10-CM

## 2022-08-01 DIAGNOSIS — R1032 Left lower quadrant pain: Secondary | ICD-10-CM | POA: Diagnosis not present

## 2022-08-01 LAB — POCT URINE PREGNANCY: Preg Test, Ur: NEGATIVE

## 2022-08-01 MED ORDER — AMOXICILLIN-POT CLAVULANATE 875-125 MG PO TABS: 1.0000 | ORAL_TABLET | Freq: Two times a day (BID) | ORAL | 0 refills | Status: DC

## 2022-08-01 MED ORDER — ESCITALOPRAM OXALATE 20 MG PO TABS: 20.0000 mg | ORAL_TABLET | Freq: Every day | ORAL | 0 refills | Status: DC

## 2022-08-01 NOTE — Assessment & Plan Note (Signed)
Her history and exam are concerning for acute diverticulitis.  CT abdomen/pelvis ordered.  Adding course of augmentin as symptoms have  worsened over the past 3 days.

## 2022-08-01 NOTE — Patient Instructions (Signed)
Diverticulitis  Diverticulitis is when small pouches in your colon get infected or swollen. This causes pain in your belly (abdomen) and watery poop (diarrhea). The small pouches are called diverticula. They may form if you have a condition called diverticulosis. What are the causes? You may get this condition if poop (stool) gets trapped in the pouches in your colon. The poop lets germs (bacteria) grow. This causes an infection. What increases the risk? You are more likely to get this condition if you have small pouches in your colon. You are also more likely to get it if: You are overweight or very overweight (obese). You do not exercise enough. You drink alcohol. You smoke. You eat a lot of red meat, like beef, pork, or lamb. You do not eat enough fiber. You are older than 27 years of age. What are the signs or symptoms? Pain in your belly. Pain is often on the left side, but it may be felt in other spots too. Fever and chills. Feeling like you may vomit. Vomiting. Having cramps. Feeling full. Changes in how often you poop. Blood in your poop. How is this treated? Most cases are treated at home. You may be told to: Take over-the-counter pain medicines. Only eat and drink clear liquids. Take antibiotics. Rest. Very bad cases may need to be treated at a hospital. Treatment may include: Not eating or drinking. Taking pain medicines. Getting antibiotics through an IV tube. Getting fluid and food through an IV tube. Having surgery. When you are feeling better, you may need to have a test to look at your colon (colonoscopy). Follow these instructions at home: Medicines Take over-the-counter and prescription medicines only as told by your doctor. These include: Fiber pills. Probiotics. Medicines to make your poop soft (stool softeners). If you were prescribed antibiotics, take them as told by your doctor. Do not stop taking them even if you start to feel better. Ask your  doctor if you should avoid driving or using machines while you are taking your medicine. Eating and drinking  Follow the diet told by your doctor. You may need to only eat and drink liquids. When you feel better, you may be able to eat more foods. You may also be told to eat a lot of fiber. Fiber helps you poop. Foods with fiber include berries, beans, lentils, and green vegetables. Try not to eat red meat. General instructions Do not smoke or use any products that contain nicotine or tobacco. If you need help quitting, ask your doctor. Exercise 3 or more times a week. Try to go for 30 minutes each time. Exercise enough to sweat and make your heart beat faster. Contact a doctor if: Your pain gets worse. You are not pooping like normal. Your symptoms do not get better. Your symptoms get worse very fast. You have a fever. You vomit more than one time. You have poop that is: Bloody. Black. Tarry. This information is not intended to replace advice given to you by your health care provider. Make sure you discuss any questions you have with your health care provider. Document Revised: 11/30/2021 Document Reviewed: 11/30/2021 Elsevier Patient Education  2023 Elsevier Inc.  

## 2022-08-01 NOTE — Progress Notes (Signed)
April Wilkerson - 27 y.o. female MRN [id number]  Date of birth: 22-Dec-1995  Subjective Chief Complaint  Patient presents with   Abdominal Pain    HPI April Wilkerson is a 27 y.o. female here today with complaint of abdominal pain.  Pain started about 3 days ago.   She has had soft, frequent stools.  Often times feels like she needs to go again right after she has a  bowel movement.  Pain located in lower left quadrant.   Denies blood in the stool, constipation, nausea or vomiting, fever or chills, urinary symptoms.    ROS:  A comprehensive ROS was completed and negative except as noted per HPI  No Known Allergies  Past Medical History:  Diagnosis Date   Anemia    Asthma, chronic 10/13/2013   Palpitations    Vitamin D deficiency     History reviewed. No pertinent surgical history.  Social History   Socioeconomic History   Marital status: Single    Spouse name: Not on file   Number of children: Not on file   Years of education: Not on file   Highest education level: Not on file  Occupational History   Occupation: student    Comment: studying healthcare management at [employer]   Tobacco Use   Smoking status: Never   Smokeless tobacco: Never  Vaping Use   Vaping Use: Never used  Substance and Sexual Activity   Alcohol use: No   Drug use: Never   Sexual activity: Yes    Birth control/protection: None    Comment: none needed at the moment   Other Topics Concern   Not on file  Social History Narrative   ** Merged History Encounter **       Social Determinants of Health   Financial Resource Strain: Not on file  Food Insecurity: Not on file  Transportation Needs: Not on file  Physical Activity: Not on file  Stress: Not on file  Social Connections: Not on file    Family History  Problem Relation Age of Onset   Cancer Mother        breast cancer, not sure what age diagnosed    Hypertension Father     Health Maintenance  Topic Date Due    COVID-19 Vaccine (4 - 2023-24 season) 11/17/2022 (Originally 11/17/2021)   Hepatitis C Screening  12/07/2022 (Originally 08/03/2013)   HIV Screening  12/07/2022 (Originally 08/04/2010)   INFLUENZA VACCINE  10/18/2022   PAP-Cervical Cytology Screening  11/08/2023   PAP SMEAR-Modifier  11/08/2023   DTaP/Tdap/Td (7 - Td or Tdap) 05/13/2028   HPV VACCINES  Completed     ----------------------------------------------------------------------------------------------------------------------------------------------------------------------------------------------------------------- Physical Exam BP 121/80 (BP Location: Left Arm, Patient Position: Sitting, Cuff Size: Normal)   Pulse 82   Ht 5\' 7"  (1.702 m)   Wt 232 lb (105.2 kg)   SpO2 100%   BMI 36.34 kg/m   Physical Exam Constitutional:      Appearance: Normal appearance.  HENT:     Head: Normocephalic and atraumatic.  Eyes:     General: No scleral icterus. Cardiovascular:     Rate and Rhythm: Normal rate and regular rhythm.  Pulmonary:     Effort: Pulmonary effort is normal.     Breath sounds: Normal breath sounds.  Abdominal:     General: Abdomen is flat. There is no distension.     Palpations: Abdomen is soft. There is no mass.     Tenderness: There is abdominal tenderness (lower and  llq). There is no guarding.  Musculoskeletal:     Cervical back: Neck supple.  Neurological:     Mental Status: She is alert.     ------------------------------------------------------------------------------------------------------------------------------------------------------------------------------------------------------------------- Assessment and Plan  LLQ abdominal pain Her history and exam are concerning for acute diverticulitis.  CT abdomen/pelvis ordered.  Adding course of augmentin as symptoms have  worsened over the past 3 days.    Meds ordered this encounter  Medications   escitalopram (LEXAPRO) 20 MG tablet    Sig: Take 1  tablet (20 mg total) by mouth daily.    Dispense:  90 tablet    Refill:  0   amoxicillin-clavulanate (AUGMENTIN) 875-125 MG tablet    Sig: Take 1 tablet by mouth 2 (two) times daily.    Dispense:  20 tablet    Refill:  0    No follow-ups on file.    This visit occurred during the SARS-CoV-2 public health emergency.  Safety protocols were in place, including screening questions prior to the visit, additional usage of staff PPE, and extensive cleaning of exam room while observing appropriate contact time as indicated for disinfecting solutions.

## 2022-08-10 ENCOUNTER — Ambulatory Visit (INDEPENDENT_AMBULATORY_CARE_PROVIDER_SITE_OTHER): Payer: 59

## 2022-08-10 DIAGNOSIS — K5732 Diverticulitis of large intestine without perforation or abscess without bleeding: Secondary | ICD-10-CM | POA: Diagnosis not present

## 2022-08-10 DIAGNOSIS — R1032 Left lower quadrant pain: Secondary | ICD-10-CM

## 2022-08-10 MED ORDER — IOHEXOL 300 MG/ML  SOLN
100.0000 mL | Freq: Once | INTRAMUSCULAR | Status: AC | PRN
  Administered 2022-08-10: 100 mL via INTRAVENOUS

## 2022-08-13 ENCOUNTER — Other Ambulatory Visit: Payer: Self-pay | Admitting: Family Medicine

## 2022-08-13 DIAGNOSIS — F331 Major depressive disorder, recurrent, moderate: Secondary | ICD-10-CM

## 2022-08-14 ENCOUNTER — Ambulatory Visit: Payer: 59 | Admitting: Professional

## 2022-09-13 ENCOUNTER — Ambulatory Visit (INDEPENDENT_AMBULATORY_CARE_PROVIDER_SITE_OTHER): Payer: 59 | Admitting: Professional

## 2022-09-13 ENCOUNTER — Encounter: Payer: Self-pay | Admitting: Professional

## 2022-09-13 DIAGNOSIS — F331 Major depressive disorder, recurrent, moderate: Secondary | ICD-10-CM

## 2022-09-13 DIAGNOSIS — F4322 Adjustment disorder with anxiety: Secondary | ICD-10-CM

## 2022-09-13 NOTE — Progress Notes (Signed)
[facility] Counselor/Therapist Progress Note  Patient ID: April Wilkerson, MRN: [id number],    Date: 09/13/2022  Time Spent: 33 minutes 804-837am  Treatment Type: Individual Therapy  Risk Assessment: Danger to Self:  No Self-injurious Behavior: No Danger to Others: No  Subjective: This session was held virtually. The patient consented to video teletherapy and was located in her car during this session. She is aware it is the responsibility of the patient to secure confidentiality on her end of the session. The provider was in a private home office for the duration of this session.   The patient arrived late for her Caregility appointment.  Issues addressed: 1-living alone in grandmother's home a-pt continues to live in her grandmother's home -she has decided to stay and her mother has been very supportive -she is paying rent to live there 2-grandmother a-is failing and is ready to enter Hospice -thinks about what is happening quite a bit and finds herself being more on social media as an escape b-pt has been struggling with crying a lot -pt is one of the more emotional people in her family -pt's mother masks her emotions but she has seen her cry a number of times -pt's sister cried last week when seeing her on ventilator -pt admits this is the most difficult moment she has been through c-pt spent time recalling positive memories -shared last time they socialized before she got sick -pt is known as "Muffin" by her family and her grandmother shared with her mom that it was the best day she could have -"I don't want her to go but her time is coming, I don't want her to suffer and I know she's tired and been through a lot." 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 Achieve an inner strength to control personal impulses, cravings, and desires that directly or indirectly increase debt irresponsibly. 2. Develop an awareness of how childhood issues have affected and continue to affect one's family life. Objective Identify the positive aspects for self of being able to forgive all those involved with the abuse. Target Date: 2023-08-21 Frequency: Monthly  Progress: 0 Modality: individual  3. Develop the necessary skills for effective, open communication, mutually satisfying sexual intimacy, and enjoyable time for companionship within the relationship. 4. Enhance ability to effectively cope with the full variety of life's worries and anxieties. 5. Gain a new sense of self-worth in which the substance of one's value is not attached to the capacity to do things or own things that cost money. Objective Identify personal traits that make undisciplined spending possible. Target Date: 2023-08-21 Frequency: Monthly  Progress: 0 Modality: individual  Objective Use cognitive and behavioral strategies to control the impulse to make unnecessary and unaffordable purchases. Target Date: 2023-08-21 Frequency: Monthly  Progress: 0 Modality: individual  Objective Write a budget that balances income with expenses. Target Date: 2023-08-21 Frequency: Monthly  Progress: 0 Modality: individual  Objective Report instances of successful control over impulse to spend on unnecessary expenses. Target Date: 2023-08-21 Frequency: Monthly  Progress: 0 Modality: individual  Objective Keep weekly and monthly  records of financial income and expenses. Target Date: 2023-08-21 Frequency: Monthly  Progress: 0 Modality: individual  6. Increase awareness of own  role in the relationship conflicts. Objective Identify problems and strengths in the relationship, including one's own role in each. Target Date: 2023-08-21 Frequency: Monthly  Progress: 0 Modality: individual  Objective Understand the origin of each other's negative emotions and reactions and develop more constructive interactions that fill needs. Target Date: 2023-08-21 Frequency: Monthly  Progress: 0 Modality: individual  Objective Identify any patterns of destructive and/or abusive behavior in the relationship. Target Date: 2023-08-21 Frequency: Monthly  Progress: 0 Modality: individual  Objective Increase flexibility of expectations, willingness to compromise, and acceptance of irreconcilable differences. Target Date: 2023-08-21 Frequency: Monthly  Progress: 0 Modality: individual  7. Learn and implement coping skills that result in a reduction of anxiety and worry, and improved daily functioning. Objective Learn and implement calming skills to reduce overall anxiety and manage anxiety symptoms. Target Date: 2023-08-21 Frequency: Monthly  Progress: 0 Modality: individual  Related Interventions Teach the client calming/relaxation skills (e.g., applied relaxation, progressive muscle relaxation, cue controlled relaxation; mindful breathing; biofeedback) and how to discriminate better between relaxation and tension; teach the client how to apply these skills to his/her daily life (e.g., New Directions in Progressive Muscle Relaxation by Marcelyn Ditty, and Hazlett-Stevens; Treating Generalized Anxiety Disorder by Rygh and Ida Rogue). Assign the client homework each session in which he/she practices relaxation exercises daily, gradually applying them progressively from non-anxiety-provoking to anxiety-provoking situations; review and reinforce success while providing corrective feedback toward improvement. Objective Learn and implement problem-solving strategies for realistically  addressing worries. Target Date: 2023-08-21 Frequency: Monthly  Progress: 0 Modality: individual  Related Interventions Assign the client a homework exercise in which he/she problem-solves a current problem (see Mastery of Your Anxiety and Worry: Workbook by Elenora Fender and Filbert Schilder or Generalized Anxiety Disorder by Elesa Hacker, and Filbert Schilder); review, reinforce success, and provide corrective feedback toward improvement. Teach the client problem-solving strategies involving specifically defining a problem, generating options for addressing it, evaluating the pros and cons of each option, selecting and implementing an optional action, and reevaluating and refining the action (or assign "Applying Problem-Solving to Interpersonal Conflict" in the Adult Psychotherapy Homework Planner by Stephannie Li). Objective Reestablish a consistent sleep-wake cycle. Target Date: 2023-08-21 Frequency: Monthly  Progress: 0 Modality: individual  Related Interventions Teach and implement sleep hygiene practices to help the client reestablish a consistent sleep-wake cycle; review, reinforce success, and provide corrective feedback toward improvement. 8. Let go of blame and begin to forgive others for pain caused in childhood. Objective Identify patterns of abuse, neglect, or abandonment within the family of origin, both current and historical, nuclear and extended. Target Date: 2023-08-21 Frequency: Monthly  Progress: 0 Modality: individual  Related Interventions Assign the client to ask parents about their family backgrounds and develop insight regarding patterns of behavior and causes for parents' dysfunction. Explore the client's painful childhood experiences (or assign "Share the Painful Memory" in the Adult Psychotherapy Homework Planner by Stephannie Li). Objective Identify feelings associated with major traumatic incidents in childhood and with parental child-rearing patterns. Target Date: 2023-08-21 Frequency: Monthly   Progress: 0 Modality: individual  Related Interventions Support and encourage the client when he/she begins to express feelings of rage, sadness, fear, and rejection relating to family abuse or neglect. Assign the client to record feelings in a journal that describes memories, behavior, and emotions tied to his/her traumatic childhood experiences (or assign "How the Trauma Affects Me" in the Adult Psychotherapy  Homework Planner by Stephannie Li). Objective Increase level of trust of others as shown by more socialization and greater intimacy tolerance. Target Date: 2023-08-21 Frequency: Monthly  Progress: 0 Modality: individual  Related Interventions Teach the client the share-check method of building trust in relationships (sharing a little information and checking as to the recipient's sensitivity in reacting to that information). Teach the client the advantages of treating people as trustworthy given a reasonable amount of time to assess their character. 9. Release the emotions associated with past childhood/family issues, resulting in less resentment and more serenity. 10. Resolve past childhood/family issues, leading to less anger and depression, greater self-esteem, security, and confidence. 11. Stabilize anxiety level while increasing ability to function on a daily basis. Objective Learn and implement relapse prevention strategies for managing possible future anxiety symptoms. Target Date: 2023-08-21 Frequency: Monthly  Progress: 0 Modality: individual  12. Understand personal desires, insecurities, and anxieties that make overspending possible.  Diagnosis:Major depressive disorder, recurrent episode, moderate (HCC)  Adjustment disorder with anxiety  Plan:  -meet again on Thursday, October 11, 2022 at 8am

## 2022-10-11 ENCOUNTER — Ambulatory Visit: Payer: 59 | Admitting: Family Medicine

## 2022-10-11 ENCOUNTER — Ambulatory Visit: Payer: 59 | Admitting: Professional

## 2022-10-11 ENCOUNTER — Encounter: Payer: Self-pay | Admitting: Family Medicine

## 2022-10-11 VITALS — BP 122/81 | HR 83 | Ht 67.0 in | Wt 250.0 lb

## 2022-10-11 DIAGNOSIS — R5383 Other fatigue: Secondary | ICD-10-CM | POA: Diagnosis not present

## 2022-10-11 DIAGNOSIS — R600 Localized edema: Secondary | ICD-10-CM | POA: Diagnosis not present

## 2022-10-11 NOTE — Patient Instructions (Signed)
Chronic Venous Insufficiency Chronic venous insufficiency is a condition that causes the veins in the legs to struggle to pump blood from the legs to the heart. It is also called venous stasis. This condition can happen when the vein walls are stretched, weakened, or damaged. It can also happen when the valves inside the vein are damaged. With the right treatment, you should be able to still lead an active life. What are the causes? Common causes of this condition include: Venous hypertension. This is high blood pressure inside the veins. Sitting or standing too long. This can cause increased blood pressure in the veins of the leg. Deep vein thrombosis (DVT). This is a blood clot that blocks blood flow in a vein. Phlebitis. This is inflammation of a vein. It can cause a blood clot to form. An abnormal growth of cells (tumor) in the area between your hip bones (pelvis). This can cause blood to back up. What increases the risk? Factors that may make you more likely to get this condition include: Having a family history of the condition. Being overweight. Being pregnant. Not getting enough exercise. Smoking. Having a job that requires you to sit or stand in one place for a long time. Being a certain age. Females in their 23s and 5s and males in their 20s are more likely to get this condition. What are the signs or symptoms? Symptoms of this condition include: Varicose veins. These are veins that are enlarged, bulging, or twisted. Skin breakdown or ulcers. Reddened skin or dark discoloration of the skin on the leg between the knee and ankle. Lipodermatosclerosis. This is brown, smooth, tight, and painful skin just above the ankle. It is often on the inside of the leg. Swelling of the legs. How is this diagnosed? This condition may be diagnosed based on your medical history and a physical exam. You may also need tests, such as: A duplex ultrasound. This shows how blood flows through a blood  vessel. Plethysmography. This tests blood flow. Venogram. This looks at the veins using an X-ray and dye. How is this treated? The goals of treatment are to help you return to an active life and to relieve pain. Treatment may include: Wearing compression stockings. These do not cure the condition but can help relieve symptoms. They can also help stop your condition from getting worse. Sclerotherapy. This involves injecting a solution to shrink damaged veins. Surgery. This may include: Vein stripping. This is when a diseased vein is taken out. Laser ablation surgery. This is when blood flow is cut off through the vein. Repairing or remaking a valve inside the affected vein. Follow these instructions at home: Lifestyle Do not use any products that contain nicotine or tobacco. These products include cigarettes, chewing tobacco, and vaping devices, such as e-cigarettes. If you need help quitting, ask your health care provider. Stay active. Exercise, walk, or do other activities. Ask your provider what activities are safe for you. General instructions Take over-the-counter and prescription medicines only as told by your provider. Drink enough fluid to keep your pee (urine) pale yellow. Wear compression stockings as told by your provider. These stockings help to prevent blood clots and reduce swelling in your legs. Keep all follow-up visits. Your provider will check your legs for any changes and adjust your treatment plan as needed. Contact a health care provider if: You have redness, swelling, or more pain in the affected area. You see a red streak or line that goes up or down from the  area. You have skin breakdown or skin loss. You get an injury in the affected area. You get a fever. Get help right away if: You have severe pain that does not get better with medicine. You get an injury and an open wound in the affected area. Your foot or ankle becomes numb or weak all of a sudden. You have  trouble moving your foot or ankle. Your symptoms do not go away or get worse. You have chest pain. You have shortness of breath. These symptoms may be an emergency. Get help right away. Call 911. Do not wait to see if the symptoms will go away. Do not drive yourself to the hospital. This information is not intended to replace advice given to you by your health care provider. Make sure you discuss any questions you have with your health care provider. Document Revised: 03/20/2022 Document Reviewed: 03/20/2022 Elsevier Patient Education  2024 [employer].

## 2022-10-13 LAB — CBC WITH DIFFERENTIAL/PLATELET: Hemoglobin: 10.9 g/dL — ABNORMAL LOW (ref 11.1–15.9)

## 2022-10-14 DIAGNOSIS — R600 Localized edema: Secondary | ICD-10-CM | POA: Insufficient documentation

## 2022-10-14 NOTE — Assessment & Plan Note (Signed)
Orders Placed This Encounter  Procedures   CMP14+EGFR   CBC with Differential   TSH + free T4   B Nat Peptide   Iron, TIBC and Ferritin Panel  Checking labs today as above.  Likely due to to venous stasis/insufficiency.  We discussed weight loss as well as increasing her movement throughout the day to help with mobilizing fluid.  Discussed that compression stockings may be helpful as well.

## 2022-10-14 NOTE — Progress Notes (Signed)
April Wilkerson - 27 y.o. female MRN [id number]  Date of birth: Nov 09, 1995  Subjective Chief Complaint  Patient presents with   Foot Swelling    HPI April Wilkerson is a 27 year old female here today with complaint lower extremity edema.  She has noticed this over the past several weeks.  She denies any significant changes to her health.  She does not have any cardiac issues.  She does sit for several hours a day at her job.  She denies any pain in her legs.  Swelling is symmetric.  She has not had any chest pain, palpitations or shortness of breath.  She is not on any medications that would contribute to swelling.  She has been consuming more sodium in her diet recently.    ROS:  A comprehensive ROS was completed and negative except as noted per HPI  No Known Allergies  Past Medical History:  Diagnosis Date   Anemia    Asthma, chronic 10/13/2013   Palpitations    Vitamin D deficiency     History reviewed. No pertinent surgical history.  Social History   Socioeconomic History   Marital status: Single    Spouse name: Not on file   Number of children: Not on file   Years of education: Not on file   Highest education level: Not on file  Occupational History   Occupation: student    Comment: studying healthcare management at [employer]   Tobacco Use   Smoking status: Never   Smokeless tobacco: Never  Vaping Use   Vaping status: Never Used  Substance and Sexual Activity   Alcohol use: No   Drug use: Never   Sexual activity: Yes    Birth control/protection: None    Comment: none needed at the moment   Other Topics Concern   Not on file  Social History Narrative   ** Merged History Encounter **       Social Determinants of Health   Financial Resource Strain: Not on file  Food Insecurity: Not on file  Transportation Needs: Not on file  Physical Activity: Not on file  Stress: Not on file  Social Connections: Not on file    Family History  Problem  Relation Age of Onset   Cancer Mother        breast cancer, not sure what age diagnosed    Hypertension Father     Health Maintenance  Topic Date Due   COVID-19 Vaccine (4 - 2023-24 season) 11/17/2022 (Originally 11/17/2021)   Hepatitis C Screening  12/07/2022 (Originally 08/03/2013)   HIV Screening  12/07/2022 (Originally 08/04/2010)   INFLUENZA VACCINE  10/18/2022   PAP-Cervical Cytology Screening  11/08/2023   PAP SMEAR-Modifier  11/08/2023   DTaP/Tdap/Td (7 - Td or Tdap) 05/13/2028   HPV VACCINES  Completed     ----------------------------------------------------------------------------------------------------------------------------------------------------------------------------------------------------------------- Physical Exam BP 122/81 (BP Location: Left Arm, Patient Position: Sitting, Cuff Size: Large)   Pulse 83   Ht 5\' 7"  (1.702 m)   Wt 250 lb (113.4 kg)   SpO2 100%   BMI 39.16 kg/m   Physical Exam Constitutional:      Appearance: Normal appearance.  Eyes:     General: No scleral icterus. Cardiovascular:     Rate and Rhythm: Normal rate and regular rhythm.  Pulmonary:     Effort: Pulmonary effort is normal.     Breath sounds: Normal breath sounds.  Musculoskeletal:     Cervical back: Neck supple.  Neurological:     General:  No focal deficit present.     Mental Status: She is alert.  Psychiatric:        Mood and Affect: Mood normal.        Behavior: Behavior normal.     ------------------------------------------------------------------------------------------------------------------------------------------------------------------------------------------------------------------- Assessment and Plan  Lower extremity edema Orders Placed This Encounter  Procedures   CMP14+EGFR   CBC with Differential   TSH + free T4   B Nat Peptide   Iron, TIBC and Ferritin Panel  Checking labs today as above.  Likely due to to venous stasis/insufficiency.  We discussed  weight loss as well as increasing her movement throughout the day to help with mobilizing fluid.  Discussed that compression stockings may be helpful as well.     No orders of the defined types were placed in this encounter.   No follow-ups on file.    This visit occurred during the SARS-CoV-2 public health emergency.  Safety protocols were in place, including screening questions prior to the visit, additional usage of staff PPE, and extensive cleaning of exam room while observing appropriate contact time as indicated for disinfecting solutions.

## 2022-11-08 ENCOUNTER — Ambulatory Visit: Payer: 59 | Admitting: Professional

## 2022-12-05 ENCOUNTER — Ambulatory Visit: Payer: 59 | Admitting: Professional

## 2022-12-21 ENCOUNTER — Encounter: Payer: Self-pay | Admitting: Family Medicine

## 2022-12-26 ENCOUNTER — Telehealth: Payer: Self-pay | Admitting: Family Medicine

## 2022-12-26 NOTE — Telephone Encounter (Signed)
Pt called in inquiring about a message that she had sent to you via mychart. Please advise

## 2022-12-26 NOTE — Telephone Encounter (Addendum)
Pls contact the patient to schedule appt to discuss ADHD evaluation and possible treatment. Can be virtual. Thanks

## 2022-12-28 ENCOUNTER — Encounter: Payer: Self-pay | Admitting: Family Medicine

## 2022-12-28 ENCOUNTER — Telehealth (INDEPENDENT_AMBULATORY_CARE_PROVIDER_SITE_OTHER): Payer: 59 | Admitting: Family Medicine

## 2022-12-28 VITALS — Ht 67.0 in | Wt 250.0 lb

## 2022-12-28 DIAGNOSIS — F988 Other specified behavioral and emotional disorders with onset usually occurring in childhood and adolescence: Secondary | ICD-10-CM | POA: Insufficient documentation

## 2022-12-28 DIAGNOSIS — F900 Attention-deficit hyperactivity disorder, predominantly inattentive type: Secondary | ICD-10-CM | POA: Diagnosis not present

## 2022-12-28 MED ORDER — LISDEXAMFETAMINE DIMESYLATE 20 MG PO CAPS: 20.0000 mg | ORAL_CAPSULE | Freq: Every day | ORAL | 0 refills | Status: DC

## 2022-12-28 NOTE — Progress Notes (Signed)
April Wilkerson - 27 y.o. female MRN [id number]  Date of birth: Jul 01, 1995   This visit type was conducted due to national recommendations for restrictions regarding the COVID-19 Pandemic (e.g. social distancing).  This format is felt to be most appropriate for this patient at this time.  All issues noted in this document were discussed and addressed.  No physical exam was performed (except for noted visual exam findings with Video Visits).  I discussed the limitations of evaluation and management by telemedicine and the availability of in person appointments. The patient expressed understanding and agreed to proceed.  I connected withNAME@ on 12/28/22 at 11:30 AM EDT by a video enabled telemedicine application and verified that I am speaking with the correct person using two identifiers.  Present at visit: [name], DO [name]   Patient Location: Home [address] [address] [place] [address]   Provider location:   [facility]  Chief Complaint  Patient presents with   ADHD    HPI  April Wilkerson is a 27 y.o. female who presents via audio/video conferencing for a telehealth visit today.  Recently had evaluation for ADHD through [facility].  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  She is interested in pursuing treatment for this.  She does have co-existing anxiety that is pretty well controlled with lexapro and vistaril.  Denies cardiac problems, palpitations.    ROS:  A comprehensive ROS was completed and negative except as noted per HPI  Past Medical History:  Diagnosis Date   Anemia    Asthma, chronic 10/13/2013   Palpitations    Vitamin D deficiency     History reviewed. No pertinent surgical history.  Family History  Problem Relation Age of Onset   Cancer Mother        breast cancer, not sure what age diagnosed    Hypertension Father     Social History   Socioeconomic History   Marital status: Single     Spouse name: Not on file   Number of children: Not on file   Years of education: Not on file   Highest education level: Not on file  Occupational History   Occupation: student    Comment: studying Development worker, community at [employer]   Tobacco Use   Smoking status: Never   Smokeless tobacco: Never  Vaping Use   Vaping status: Never Used  Substance and Sexual Activity   Alcohol use: No   Drug use: Never   Sexual activity: Yes    Birth control/protection: None    Comment: none needed at the moment   Other Topics Concern   Not on file  Social History Narrative   ** Merged History Encounter **       Social Determinants of Health   Financial Resource Strain: Not on file  Food Insecurity: Not on file  Transportation Needs: Not on file  Physical Activity: Not on file  Stress: Not on file  Social Connections: Not on file  Intimate Partner Violence: Not on file     Current Outpatient Medications:    albuterol (VENTOLIN HFA) 108 (90 Base) MCG/ACT inhaler, Inhale 1-2 puffs into the lungs every 4 (four) hours as needed for wheezing or shortness of breath., Disp: 18 g, Rfl: 1   aspirin-acetaminophen-caffeine (EXCEDRIN MIGRAINE) 250-250-65 MG tablet, Take 1-2 tablets by mouth every 6 (six) hours as needed for headache., Disp: 30 tablet, Rfl: 0   escitalopram (LEXAPRO) 20 MG tablet, TAKE 1 TABLET  BY MOUTH EVERY DAY, Disp: 30 tablet, Rfl: 2   fluticasone (FLONASE) 50 MCG/ACT nasal spray, SHAKE LIQUID AND USE 2 SPRAYS IN EACH NOSTRIL DAILY, Disp: 16 g, Rfl: 0   hydrOXYzine (VISTARIL) 25 MG capsule, Take 1 capsule (25 mg total) by mouth 3 (three) times daily as needed for anxiety., Disp: 90 capsule, Rfl: 0   lisdexamfetamine (VYVANSE) 20 MG capsule, Take 1 capsule (20 mg total) by mouth daily., Disp: 30 capsule, Rfl: 0  EXAM:  VITALS per patient if applicable: Ht 5\' 7"  (1.702 m)   Wt 250 lb (113.4 kg)   BMI 39.16 kg/m   GENERAL: alert, oriented, appears well and in no acute  distress  HEENT: atraumatic, conjunttiva clear, no obvious abnormalities on inspection of external nose and ears  NECK: normal movements of the head and neck  LUNGS: on inspection no signs of respiratory distress, breathing rate appears normal, no obvious gross SOB, gasping or wheezing  CV: no obvious cyanosis  MS: moves all visible extremities without noticeable abnormality  PSYCH/NEURO: pleasant and cooperative, no obvious depression or anxiety, speech and thought processing grossly intact  ASSESSMENT AND PLAN:  Discussed the following assessment and plan:  ADD (attention deficit disorder) Starting vyvanse 20mg  daily.  Reviewed potential side effects.  F/u in 1 month.       I discussed the assessment and treatment plan with the patient. The patient was provided an opportunity to ask questions and all were answered. The patient agreed with the plan and demonstrated an understanding of the instructions.   The patient was advised to call back or seek an in-person evaluation if the symptoms worsen or if the condition fails to improve as anticipated.    [name], DO

## 2022-12-28 NOTE — Assessment & Plan Note (Signed)
Starting vyvanse 20mg  daily.  Reviewed potential side effects.  F/u in 1 month.

## 2022-12-31 ENCOUNTER — Ambulatory Visit (INDEPENDENT_AMBULATORY_CARE_PROVIDER_SITE_OTHER): Payer: 59 | Admitting: Professional

## 2022-12-31 ENCOUNTER — Encounter: Payer: Self-pay | Admitting: Professional

## 2022-12-31 DIAGNOSIS — F4322 Adjustment disorder with anxiety: Secondary | ICD-10-CM

## 2022-12-31 DIAGNOSIS — F331 Major depressive disorder, recurrent, moderate: Secondary | ICD-10-CM | POA: Diagnosis not present

## 2022-12-31 NOTE — Progress Notes (Signed)
[facility] Counselor/Therapist Progress Note  Patient ID: April Wilkerson, MRN: [id number],    Date: 12/31/2022  Time Spent: 44 minutes 805-849am  Treatment Type: Individual Therapy  Risk Assessment: Danger to Self:  No Self-injurious Behavior: No Danger to Others: No  Subjective: This session was held virtually. The patient consented to video teletherapy and was located in her car during this session. She is aware it is the responsibility of the patient to secure confidentiality on her end of the session. The provider was in a private home office for the duration of this session.   The patient arrived late for her Caregility appointment.  Issues addressed: 1-grandmother a-passed in July -struggling quite a bit -has been having issues spiritually as a result -since she passed she has not done anything spiritually -she has completed shut down, she is angry and frustrated with God -she started journaling about it b-what was grandmother's life before/after -pt shared vibrant life that her grandmother lived -she shared how it had changed -quality of life for grandmother was not good -pt admits that it was no way for her grandmother to live c-mood -isolating -not minding her nutrition and has gained some weight -recently evaluated    12/28/2022   10:26 AM 10/11/2022    3:25 PM 08/01/2022    3:49 PM  Depression screen PHQ 2/9  Decreased Interest 0 0 1  Down, Depressed, Hopeless 0 0 1  PHQ - 2 Score 0 0 2  Altered sleeping 1  1  Tired, decreased energy 1  1  Change in appetite 1  1  Feeling bad or failure about yourself  1  1  Trouble concentrating 2  2  Moving slowly or fidgety/restless 0  0  Suicidal thoughts 0  0  PHQ-9 Score 6  8  Difficult doing work/chores Somewhat difficult  Somewhat difficult  -pt doesn't think she is through the worst and admits the seasonal depression is not helping d-medication -pt continues to take her antidepressant  Escitalopram -she was prescribed medication for ADD e-strategies -talk to mom more honestly about how she feels -she doesn't shared the full truth -continue getting support from [place] -sister would listen but unsure it would be helpful -follow your schedule -plan a weekend activity -schedule some time to grieve  Treatment Plan Problems Addressed  Anxiety, Childhood Trauma, Financial Stress, Intimate Relationship Conflicts Goals 1. Achieve an inner strength to control personal impulses, cravings, and desires that directly or indirectly increase debt irresponsibly. 2. Develop an awareness of how childhood issues have affected and continue to affect one's family life. Objective Identify the positive aspects for self of being able to forgive all those involved with the abuse. Target Date: 2023-08-21 Frequency: Monthly  Progress: 0 Modality: individual  3. Develop the necessary skills for effective, open communication, mutually satisfying sexual intimacy, and enjoyable time for companionship within the relationship. 4. Enhance ability to effectively cope with the full variety of life's worries and anxieties. 5. Gain a new sense of self-worth in which the substance of one's value is not attached to the capacity to do things or own things that cost money. Objective Identify personal traits that make undisciplined spending possible. Target Date: 2023-08-21 Frequency: Monthly  Progress: 0 Modality: individual  Objective Use cognitive and behavioral strategies to control the impulse to make unnecessary and unaffordable purchases. Target Date: 2023-08-21 Frequency: Monthly  Progress: 0 Modality: individual  Objective Write a budget that balances income with expenses. Target Date: 2023-08-21 Frequency: Monthly  Progress: 0 Modality: individual  Objective Report instances of successful control over impulse to spend on unnecessary expenses. Target Date: 2023-08-21 Frequency: Monthly   Progress: 0 Modality: individual  Objective Keep weekly and monthly records of financial income and expenses. Target Date: 2023-08-21 Frequency: Monthly  Progress: 0 Modality: individual  6. Increase awareness of own role in the relationship conflicts. Objective Identify problems and strengths in the relationship, including one's own role in each. Target Date: 2023-08-21 Frequency: Monthly  Progress: 0 Modality: individual  Objective Understand the origin of each other's negative emotions and reactions and develop more constructive interactions that fill needs. Target Date: 2023-08-21 Frequency: Monthly  Progress: 0 Modality: individual  Objective Identify any patterns of destructive and/or abusive behavior in the relationship. Target Date: 2023-08-21 Frequency: Monthly  Progress: 0 Modality: individual  Objective Increase flexibility of expectations, willingness to compromise, and acceptance of irreconcilable differences. Target Date: 2023-08-21 Frequency: Monthly  Progress: 0 Modality: individual  7. Learn and implement coping skills that result in a reduction of anxiety and worry, and improved daily functioning. Objective Learn and implement calming skills to reduce overall anxiety and manage anxiety symptoms. Target Date: 2023-08-21 Frequency: Monthly  Progress: 0 Modality: individual  Related Interventions Teach the client calming/relaxation skills (e.g., applied relaxation, progressive muscle relaxation, cue controlled relaxation; mindful breathing; biofeedback) and how to discriminate better between relaxation and tension; teach the client how to apply these skills to his/her daily life (e.g., New Directions in Progressive Muscle Relaxation by Marcelyn Ditty, and Hazlett-Stevens; Treating Generalized Anxiety Disorder by Rygh and Ida Rogue). Assign the client homework each session in which he/she practices relaxation exercises daily, gradually applying them progressively  from non-anxiety-provoking to anxiety-provoking situations; review and reinforce success while providing corrective feedback toward improvement. Objective Learn and implement problem-solving strategies for realistically addressing worries. Target Date: 2023-08-21 Frequency: Monthly  Progress: 0 Modality: individual  Related Interventions Assign the client a homework exercise in which he/she problem-solves a current problem (see Mastery of Your Anxiety and Worry: Workbook by Elenora Fender and Filbert Schilder or Generalized Anxiety Disorder by Elesa Hacker, and Filbert Schilder); review, reinforce success, and provide corrective feedback toward improvement. Teach the client problem-solving strategies involving specifically defining a problem, generating options for addressing it, evaluating the pros and cons of each option, selecting and implementing an optional action, and reevaluating and refining the action (or assign "Applying Problem-Solving to Interpersonal Conflict" in the Adult Psychotherapy Homework Planner by Stephannie Li). Objective Reestablish a consistent sleep-wake cycle. Target Date: 2023-08-21 Frequency: Monthly  Progress: 0 Modality: individual  Related Interventions Teach and implement sleep hygiene practices to help the client reestablish a consistent sleep-wake cycle; review, reinforce success, and provide corrective feedback toward improvement. 8. Let go of blame and begin to forgive others for pain caused in childhood. Objective Identify patterns of abuse, neglect, or abandonment within the family of origin, both current and historical, nuclear and extended. Target Date: 2023-08-21 Frequency: Monthly  Progress: 0 Modality: individual  Related Interventions Assign the client to ask parents about their family backgrounds and develop insight regarding patterns of behavior and causes for parents' dysfunction. Explore the client's painful childhood experiences (or assign "Share the Painful Memory" in the  Adult Psychotherapy Homework Planner by Stephannie Li). Objective Identify feelings associated with major traumatic incidents in childhood and with parental child-rearing patterns. Target Date: 2023-08-21 Frequency: Monthly  Progress: 0 Modality: individual  Related Interventions Support and encourage the client when he/she begins to express feelings of rage, sadness, fear, and rejection relating to family abuse  or neglect. Assign the client to record feelings in a journal that describes memories, behavior, and emotions tied to his/her traumatic childhood experiences (or assign "How the Trauma Affects Me" in the Adult Psychotherapy Homework Planner by Stephannie Li). Objective Increase level of trust of others as shown by more socialization and greater intimacy tolerance. Target Date: 2023-08-21 Frequency: Monthly  Progress: 0 Modality: individual  Related Interventions Teach the client the share-check method of building trust in relationships (sharing a little information and checking as to the recipient's sensitivity in reacting to that information). Teach the client the advantages of treating people as trustworthy given a reasonable amount of time to assess their character. 9. Release the emotions associated with past childhood/family issues, resulting in less resentment and more serenity. 10. Resolve past childhood/family issues, leading to less anger and depression, greater self-esteem, security, and confidence. 11. Stabilize anxiety level while increasing ability to function on a daily basis. Objective Learn and implement relapse prevention strategies for managing possible future anxiety symptoms. Target Date: 2023-08-21 Frequency: Monthly  Progress: 0 Modality: individual  12. Understand personal desires, insecurities, and anxieties that make overspending possible.  Diagnosis:Major depressive disorder, recurrent episode, moderate (HCC)  Adjustment disorder with anxiety  Plan:  -meet again on  Wednesday, January 16, 2023 at 5pm

## 2023-01-13 ENCOUNTER — Other Ambulatory Visit: Payer: Self-pay | Admitting: Family Medicine

## 2023-01-13 DIAGNOSIS — F331 Major depressive disorder, recurrent, moderate: Secondary | ICD-10-CM

## 2023-01-15 ENCOUNTER — Ambulatory Visit: Payer: 59 | Admitting: Family Medicine

## 2023-01-15 ENCOUNTER — Encounter: Payer: Self-pay | Admitting: Family Medicine

## 2023-01-15 VITALS — BP 128/81 | HR 104 | Ht 65.0 in | Wt 251.0 lb

## 2023-01-15 DIAGNOSIS — J069 Acute upper respiratory infection, unspecified: Secondary | ICD-10-CM | POA: Insufficient documentation

## 2023-01-15 DIAGNOSIS — J029 Acute pharyngitis, unspecified: Secondary | ICD-10-CM

## 2023-01-15 LAB — POCT RAPID STREP A (OFFICE): Rapid Strep A Screen: NEGATIVE

## 2023-01-15 LAB — POC COVID19 BINAXNOW: SARS Coronavirus 2 Ag: NEGATIVE

## 2023-01-15 MED ORDER — PREDNISONE 20 MG PO TABS: 20.0000 mg | ORAL_TABLET | Freq: Two times a day (BID) | ORAL | 0 refills | Status: DC

## 2023-01-15 NOTE — Assessment & Plan Note (Signed)
POC COVID and strep testing is negative.  Adding prednisone for pharnygitis and eustachian tube dysfunction.  Encouraged supportive care and good hydration. F/u if not improving.

## 2023-01-15 NOTE — Progress Notes (Signed)
April Wilkerson - 27 y.o. female MRN [id number]  Date of birth: 1995/12/07  Subjective Chief Complaint  Patient presents with   Sore Throat    C/o dizziness, ST and headache x 2 days.     HPI April Wilkerson is a 27 y.o. female here today with complaint of sore throat, headache and dizziness.  Symptoms started a couple of days ago.  So far she has tried dayquil.  She does feel pressure in her ears.  Denies nausea, vomiting, diarrhea, body aches, fever or chills. She is drinking plenty of fluids.    ROS:  A comprehensive ROS was completed and negative except as noted per HPI  No Known Allergies  Past Medical History:  Diagnosis Date   Anemia    Asthma, chronic 10/13/2013   Palpitations    Vitamin D deficiency     No past surgical history on file.  Social History   Socioeconomic History   Marital status: Single    Spouse name: Not on file   Number of children: Not on file   Years of education: Not on file   Highest education level: Not on file  Occupational History   Occupation: student    Comment: studying healthcare management at [employer]   Tobacco Use   Smoking status: Never   Smokeless tobacco: Never  Vaping Use   Vaping status: Never Used  Substance and Sexual Activity   Alcohol use: No   Drug use: Never   Sexual activity: Yes    Birth control/protection: None    Comment: none needed at the moment   Other Topics Concern   Not on file  Social History Narrative   ** Merged History Encounter **       Social Determinants of Health   Financial Resource Strain: Not on file  Food Insecurity: Not on file  Transportation Needs: Not on file  Physical Activity: Not on file  Stress: Not on file  Social Connections: Not on file    Family History  Problem Relation Age of Onset   Cancer Mother        breast cancer, not sure what age diagnosed    Hypertension Father     Health Maintenance  Topic Date Due   HIV Screening  Never done   Hepatitis  C Screening  Never done   COVID-19 Vaccine (4 - 2023-24 season) 11/18/2022   INFLUENZA VACCINE  06/17/2023 (Originally 10/18/2022)   Cervical Cancer Screening (Pap smear)  11/08/2023   DTaP/Tdap/Td (7 - Td or Tdap) 05/13/2028   HPV VACCINES  Completed     ----------------------------------------------------------------------------------------------------------------------------------------------------------------------------------------------------------------- Physical Exam BP 128/81   Pulse (!) 104   Ht 5\' 5"  (1.651 m)   Wt 251 lb (113.9 kg)   SpO2 99%   BMI 41.77 kg/m   Physical Exam HENT:     Mouth/Throat:     Comments: Posterior OP erythema  Eyes:     General: No scleral icterus. Cardiovascular:     Rate and Rhythm: Normal rate and regular rhythm.  Pulmonary:     Effort: Pulmonary effort is normal.     Breath sounds: Normal breath sounds.  Neurological:     Mental Status: She is alert.  Psychiatric:        Mood and Affect: Mood normal.        Behavior: Behavior normal.     ------------------------------------------------------------------------------------------------------------------------------------------------------------------------------------------------------------------- Assessment and Plan  URI (upper respiratory infection) POC COVID and strep testing is negative.  Adding prednisone for  pharnygitis and eustachian tube dysfunction.  Encouraged supportive care and good hydration. F/u if not improving.    Meds ordered this encounter  Medications   predniSONE (DELTASONE) 20 MG tablet    Sig: Take 1 tablet (20 mg total) by mouth 2 (two) times daily with a meal.    Dispense:  10 tablet    Refill:  0    No follow-ups on file.    This visit occurred during the SARS-CoV-2 public health emergency.  Safety protocols were in place, including screening questions prior to the visit, additional usage of staff PPE, and extensive cleaning of exam room while  observing appropriate contact time as indicated for disinfecting solutions.

## 2023-01-15 NOTE — Patient Instructions (Signed)

## 2023-01-16 ENCOUNTER — Ambulatory Visit: Payer: 59 | Admitting: Professional

## 2023-01-17 ENCOUNTER — Telehealth: Payer: Self-pay | Admitting: Family Medicine

## 2023-01-17 NOTE — Telephone Encounter (Signed)
Patient called stating she is still having a persistent cough. Patient was told to contact the office if this happened to get a prescription to help.   Patient is also requesting a refill of albuterol (VENTOLIN HFA) 108 (90 Base) MCG/ACT inhaler [[id number]]   Pharmacy -  [id number] - [place], [place] - [address]

## 2023-01-18 MED ORDER — ALBUTEROL SULFATE HFA 108 (90 BASE) MCG/ACT IN AERS: 1.0000 | INHALATION_SPRAY | RESPIRATORY_TRACT | 2 refills | Status: DC | PRN

## 2023-01-18 MED ORDER — BENZONATATE 200 MG PO CAPS: 200.0000 mg | ORAL_CAPSULE | Freq: Two times a day (BID) | ORAL | 0 refills | Status: DC | PRN

## 2023-01-21 ENCOUNTER — Ambulatory Visit: Payer: 59 | Admitting: Professional

## 2023-01-22 ENCOUNTER — Telehealth: Payer: Self-pay | Admitting: Family Medicine

## 2023-01-22 NOTE — Telephone Encounter (Signed)
Patient called stating that she was seen last week and was given medication. She said that she was told if she did not feel better after finishing the medication to let us know to get something else. Please advise.

## 2023-01-23 MED ORDER — AZITHROMYCIN 250 MG PO TABS: ORAL_TABLET | ORAL | 0 refills | Status: AC

## 2023-01-23 NOTE — Telephone Encounter (Signed)
Azithromycin sent in.   CM

## 2023-02-04 ENCOUNTER — Ambulatory Visit (INDEPENDENT_AMBULATORY_CARE_PROVIDER_SITE_OTHER): Payer: 59 | Admitting: Professional

## 2023-02-04 ENCOUNTER — Encounter: Payer: Self-pay | Admitting: Professional

## 2023-02-04 DIAGNOSIS — F331 Major depressive disorder, recurrent, moderate: Secondary | ICD-10-CM

## 2023-02-04 DIAGNOSIS — F4322 Adjustment disorder with anxiety: Secondary | ICD-10-CM

## 2023-02-04 NOTE — Progress Notes (Signed)
[facility] Counselor/Therapist Progress Note  Patient ID: April Wilkerson, MRN: [id number],    Date: 02/04/2023  Time Spent: 46 minutes [id number]  Treatment Type: Individual Therapy  Risk Assessment: Danger to Self:  No Self-injurious Behavior: No Danger to Others: No  Subjective: This session was held virtually. The patient consented to video teletherapy and was located in her car during this session. She is aware it is the responsibility of the patient to secure confidentiality on her end of the session. The provider was in a private home office for the duration of this session.   The patient arrived late for her Caregility appointment.  Issues addressed: 1-grandmother a-has talked with her mom about her feelings b-has began praying again and initially she was angry with God -feeling a lot better c-keeps a picture of her and her grandmother on the coffee table d-mood -feeling a lot better than last visit -describes her mood as a 7 on a scale of 1/10 -most days she feel good with some days not as good -she admits the word could be content and has noticed she is excited for the day e-relationship with [name] -she doesn't think anything is going wrong with [name] -a former relationship, Precious, contacted the pt -they still follow each other on Instagram but she never "liked" anything -pt had taken about a 90 day break and when she got online she found that Precious had gotten back together with Alcario Drought -pt was dating Precious when she was living with Napoleon and playing the women against each other -pt doesn't have any feelings for Precious but admits that she struggles with uncertain feelings  Treatment Plan Problems Addressed  Anxiety, Childhood Trauma, Financial Stress, Intimate Relationship Conflicts Goals 1. Achieve an inner strength to control personal impulses, cravings, and desires that directly or indirectly increase debt irresponsibly. 2.  Develop an awareness of how childhood issues have affected and continue to affect one's family life. Objective Identify the positive aspects for self of being able to forgive all those involved with the abuse. Target Date: 2023-08-21 Frequency: Monthly  Progress: 0 Modality: individual  3. Develop the necessary skills for effective, open communication, mutually satisfying sexual intimacy, and enjoyable time for companionship within the relationship. 4. Enhance ability to effectively cope with the full variety of life's worries and anxieties. 5. Gain a new sense of self-worth in which the substance of one's value is not attached to the capacity to do things or own things that cost money. Objective Identify personal traits that make undisciplined spending possible. Target Date: 2023-08-21 Frequency: Monthly  Progress: 0 Modality: individual  Objective Use cognitive and behavioral strategies to control the impulse to make unnecessary and unaffordable purchases. Target Date: 2023-08-21 Frequency: Monthly  Progress: 0 Modality: individual  Objective Write a budget that balances income with expenses. Target Date: 2023-08-21 Frequency: Monthly  Progress: 0 Modality: individual  Objective Report instances of successful control over impulse to spend on unnecessary expenses. Target Date: 2023-08-21 Frequency: Monthly  Progress: 0 Modality: individual  Objective Keep weekly and monthly records of financial income and expenses. Target Date: 2023-08-21 Frequency: Monthly  Progress: 0 Modality: individual  6. Increase awareness of own role in the relationship conflicts. Objective Identify problems and strengths in the relationship, including one's own role in each. Target Date: 2023-08-21 Frequency: Monthly  Progress: 0 Modality: individual  Objective Understand the origin of each other's negative emotions and reactions and develop more constructive interactions that fill needs. Target Date:  2023-08-21 Frequency: Monthly  Progress: 0 Modality: individual  Objective Identify any patterns of destructive and/or abusive behavior in the relationship. Target Date: 2023-08-21 Frequency: Monthly  Progress: 0 Modality: individual  Objective Increase flexibility of expectations, willingness to compromise, and acceptance of irreconcilable differences. Target Date: 2023-08-21 Frequency: Monthly  Progress: 0 Modality: individual  7. Learn and implement coping skills that result in a reduction of anxiety and worry, and improved daily functioning. Objective Learn and implement calming skills to reduce overall anxiety and manage anxiety symptoms. Target Date: 2023-08-21 Frequency: Monthly  Progress: 0 Modality: individual  Related Interventions Teach the client calming/relaxation skills (e.g., applied relaxation, progressive muscle relaxation, cue controlled relaxation; mindful breathing; biofeedback) and how to discriminate better between relaxation and tension; teach the client how to apply these skills to his/her daily life (e.g., New Directions in Progressive Muscle Relaxation by Marcelyn Ditty, and Hazlett-Stevens; Treating Generalized Anxiety Disorder by Rygh and Ida Rogue). Assign the client homework each session in which he/she practices relaxation exercises daily, gradually applying them progressively from non-anxiety-provoking to anxiety-provoking situations; review and reinforce success while providing corrective feedback toward improvement. Objective Learn and implement problem-solving strategies for realistically addressing worries. Target Date: 2023-08-21 Frequency: Monthly  Progress: 0 Modality: individual  Related Interventions Assign the client a homework exercise in which he/she problem-solves a current problem (see Mastery of Your Anxiety and Worry: Workbook by Elenora Fender and Filbert Schilder or Generalized Anxiety Disorder by Elesa Hacker, and Filbert Schilder); review, reinforce success, and  provide corrective feedback toward improvement. Teach the client problem-solving strategies involving specifically defining a problem, generating options for addressing it, evaluating the pros and cons of each option, selecting and implementing an optional action, and reevaluating and refining the action (or assign "Applying Problem-Solving to Interpersonal Conflict" in the Adult Psychotherapy Homework Planner by Stephannie Li). Objective Reestablish a consistent sleep-wake cycle. Target Date: 2023-08-21 Frequency: Monthly  Progress: 0 Modality: individual  Related Interventions Teach and implement sleep hygiene practices to help the client reestablish a consistent sleep-wake cycle; review, reinforce success, and provide corrective feedback toward improvement. 8. Let go of blame and begin to forgive others for pain caused in childhood. Objective Identify patterns of abuse, neglect, or abandonment within the family of origin, both current and historical, nuclear and extended. Target Date: 2023-08-21 Frequency: Monthly  Progress: 0 Modality: individual  Related Interventions Assign the client to ask parents about their family backgrounds and develop insight regarding patterns of behavior and causes for parents' dysfunction. Explore the client's painful childhood experiences (or assign "Share the Painful Memory" in the Adult Psychotherapy Homework Planner by Stephannie Li). Objective Identify feelings associated with major traumatic incidents in childhood and with parental child-rearing patterns. Target Date: 2023-08-21 Frequency: Monthly  Progress: 0 Modality: individual  Related Interventions Support and encourage the client when he/she begins to express feelings of rage, sadness, fear, and rejection relating to family abuse or neglect. Assign the client to record feelings in a journal that describes memories, behavior, and emotions tied to his/her traumatic childhood experiences (or assign "How the Trauma  Affects Me" in the Adult Psychotherapy Homework Planner by Stephannie Li). Objective Increase level of trust of others as shown by more socialization and greater intimacy tolerance. Target Date: 2023-08-21 Frequency: Monthly  Progress: 0 Modality: individual  Related Interventions Teach the client the share-check method of building trust in relationships (sharing a little information and checking as to the recipient's sensitivity in reacting to that information). Teach the client the advantages of treating people as trustworthy given a reasonable amount of time to assess  their character. 9. Release the emotions associated with past childhood/family issues, resulting in less resentment and more serenity. 10. Resolve past childhood/family issues, leading to less anger and depression, greater self-esteem, security, and confidence. 11. Stabilize anxiety level while increasing ability to function on a daily basis. Objective Learn and implement relapse prevention strategies for managing possible future anxiety symptoms. Target Date: 2023-08-21 Frequency: Monthly  Progress: 0 Modality: individual  12. Understand personal desires, insecurities, and anxieties that make overspending possible.  Diagnosis:Major depressive disorder, recurrent episode, moderate (HCC)  Adjustment disorder with anxiety  Plan:  -meet again on Wednesday, February 20, 2023 at 1pm

## 2023-02-06 ENCOUNTER — Telehealth (INDEPENDENT_AMBULATORY_CARE_PROVIDER_SITE_OTHER): Payer: 59 | Admitting: Family Medicine

## 2023-02-06 ENCOUNTER — Encounter: Payer: Self-pay | Admitting: Family Medicine

## 2023-02-06 DIAGNOSIS — F900 Attention-deficit hyperactivity disorder, predominantly inattentive type: Secondary | ICD-10-CM

## 2023-02-06 DIAGNOSIS — F988 Other specified behavioral and emotional disorders with onset usually occurring in childhood and adolescence: Secondary | ICD-10-CM | POA: Diagnosis not present

## 2023-02-06 MED ORDER — LISDEXAMFETAMINE DIMESYLATE 10 MG PO CAPS: 10.0000 mg | ORAL_CAPSULE | Freq: Every day | ORAL | 0 refills | Status: DC

## 2023-02-06 NOTE — Progress Notes (Signed)
April Wilkerson - 27 y.o. female MRN [id number]  Date of birth: May 24, 1995   This visit type was conducted due to national recommendations for restrictions regarding the COVID-19 Pandemic (e.g. social distancing).  This format is felt to be most appropriate for this patient at this time.  All issues noted in this document were discussed and addressed.  No physical exam was performed (except for noted visual exam findings with Video Visits).  I discussed the limitations of evaluation and management by telemedicine and the availability of in person appointments. The patient expressed understanding and agreed to proceed.  I connected withNAME@ on 02/06/23 at  2:50 PM EST by a video enabled telemedicine application and verified that I am speaking with the correct person using two identifiers.  Present at visit: [name], DO Anavey Charna Busman   Patient Location: Home [address] [address] [place] [address]   Provider location:   [facility]  No chief complaint on file.   HPI  April Wilkerson is a 27 y.o. female who presents via audio/video conferencing for a telehealth visit today.  She is following up for ADD.  Started on Vyvanse 20mg  daily.  She reports that medication does seem to be helpful, however she is having difficulty with falling asleep at night.  She has not had any other side effects.   ROS:  A comprehensive ROS was completed and negative except as noted per HPI  Past Medical History:  Diagnosis Date   Anemia    Asthma, chronic 10/13/2013   Palpitations    Vitamin D deficiency     No past surgical history on file.  Family History  Problem Relation Age of Onset   Cancer Mother        breast cancer, not sure what age diagnosed    Hypertension Father     Social History   Socioeconomic History   Marital status: Single    Spouse name: Not on file   Number of children: Not on file   Years of education: Not on file   Highest education level: Not on file   Occupational History   Occupation: student    Comment: studying Development worker, community at [employer]   Tobacco Use   Smoking status: Never   Smokeless tobacco: Never  Vaping Use   Vaping status: Never Used  Substance and Sexual Activity   Alcohol use: No   Drug use: Never   Sexual activity: Yes    Birth control/protection: None    Comment: none needed at the moment   Other Topics Concern   Not on file  Social History Narrative   ** Merged History Encounter **       Social Determinants of Health   Financial Resource Strain: Not on file  Food Insecurity: Not on file  Transportation Needs: Not on file  Physical Activity: Not on file  Stress: Not on file  Social Connections: Not on file  Intimate Partner Violence: Not on file     Current Outpatient Medications:    benzonatate (TESSALON) 200 MG capsule, Take 1 capsule (200 mg total) by mouth 2 (two) times daily as needed for cough., Disp: 20 capsule, Rfl: 0   albuterol (VENTOLIN HFA) 108 (90 Base) MCG/ACT inhaler, Inhale 1-2 puffs into the lungs every 4 (four) hours as needed for wheezing or shortness of breath., Disp: 18 g, Rfl: 2   aspirin-acetaminophen-caffeine (EXCEDRIN MIGRAINE) 250-250-65 MG tablet, Take 1-2 tablets by mouth every 6 (six) hours as needed for headache.,  Disp: 30 tablet, Rfl: 0   escitalopram (LEXAPRO) 20 MG tablet, TAKE 1 TABLET BY MOUTH EVERY DAY, Disp: 30 tablet, Rfl: 2   fluticasone (FLONASE) 50 MCG/ACT nasal spray, SHAKE LIQUID AND USE 2 SPRAYS IN EACH NOSTRIL DAILY, Disp: 16 g, Rfl: 0   hydrOXYzine (VISTARIL) 25 MG capsule, Take 1 capsule (25 mg total) by mouth 3 (three) times daily as needed for anxiety., Disp: 90 capsule, Rfl: 0   lisdexamfetamine (VYVANSE) 10 MG capsule, Take 1 capsule (10 mg total) by mouth daily., Disp: 30 capsule, Rfl: 0   predniSONE (DELTASONE) 20 MG tablet, Take 1 tablet (20 mg total) by mouth 2 (two) times daily with a meal., Disp: 10 tablet, Rfl: 0  EXAM:  VITALS per patient  if applicable: There were no vitals taken for this visit.  GENERAL: alert, oriented, appears well and in no acute distress  HEENT: atraumatic, conjunttiva clear, no obvious abnormalities on inspection of external nose and ears  NECK: normal movements of the head and neck  LUNGS: on inspection no signs of respiratory distress, breathing rate appears normal, no obvious gross SOB, gasping or wheezing  CV: no obvious cyanosis  MS: moves all visible extremities without noticeable abnormality  PSYCH/NEURO: pleasant and cooperative, no obvious depression or anxiety, speech and thought processing grossly intact  ASSESSMENT AND PLAN:  Discussed the following assessment and plan:  ADD (attention deficit disorder) Reducing vyanse to 10mg  daily.  If still having insomnia we can consider change to IR medication and dose BID.     I discussed the assessment and treatment plan with the patient. The patient was provided an opportunity to ask questions and all were answered. The patient agreed with the plan and demonstrated an understanding of the instructions.   The patient was advised to call back or seek an in-person evaluation if the symptoms worsen or if the condition fails to improve as anticipated.    [name], DO

## 2023-02-06 NOTE — Assessment & Plan Note (Signed)
Reducing vyanse to 10mg  daily.  If still having insomnia we can consider change to IR medication and dose BID.

## 2023-02-20 ENCOUNTER — Ambulatory Visit: Payer: 59 | Admitting: Professional

## 2023-02-26 ENCOUNTER — Encounter: Payer: 59 | Admitting: Family Medicine

## 2023-03-04 ENCOUNTER — Ambulatory Visit: Payer: 59 | Admitting: Professional

## 2023-03-07 ENCOUNTER — Encounter: Payer: 59 | Admitting: Family Medicine

## 2023-03-08 ENCOUNTER — Ambulatory Visit: Payer: 59 | Admitting: Family Medicine

## 2023-03-26 ENCOUNTER — Ambulatory Visit: Payer: 59 | Admitting: Professional

## 2023-04-30 ENCOUNTER — Ambulatory Visit (INDEPENDENT_AMBULATORY_CARE_PROVIDER_SITE_OTHER): Payer: 59 | Admitting: Family Medicine

## 2023-04-30 ENCOUNTER — Encounter: Payer: Self-pay | Admitting: Family Medicine

## 2023-04-30 VITALS — BP 132/85 | HR 87 | Ht 65.0 in | Wt 257.0 lb

## 2023-04-30 DIAGNOSIS — Z803 Family history of malignant neoplasm of breast: Secondary | ICD-10-CM

## 2023-04-30 DIAGNOSIS — F900 Attention-deficit hyperactivity disorder, predominantly inattentive type: Secondary | ICD-10-CM | POA: Diagnosis not present

## 2023-04-30 DIAGNOSIS — Z0000 Encounter for general adult medical examination without abnormal findings: Secondary | ICD-10-CM

## 2023-04-30 DIAGNOSIS — Z13220 Encounter for screening for lipoid disorders: Secondary | ICD-10-CM

## 2023-04-30 DIAGNOSIS — Z23 Encounter for immunization: Secondary | ICD-10-CM | POA: Diagnosis not present

## 2023-04-30 DIAGNOSIS — J309 Allergic rhinitis, unspecified: Secondary | ICD-10-CM | POA: Diagnosis not present

## 2023-04-30 MED ORDER — AMPHETAMINE-DEXTROAMPHET ER 10 MG PO CP24: 10.0000 mg | ORAL_CAPSULE | Freq: Every day | ORAL | 0 refills | Status: DC

## 2023-04-30 NOTE — Patient Instructions (Addendum)
Try Astepro nasal spray.   Preventive Care 49-28 Years Old, Female Preventive care refers to lifestyle choices and visits with your health care provider that can promote health and wellness. Preventive care visits are also called wellness exams. What can I expect for my preventive care visit? Counseling During your preventive care visit, your health care provider may ask about your: Medical history, including: Past medical problems. Family medical history. Pregnancy history. Current health, including: Menstrual cycle. Method of birth control. Emotional well-being. Home life and relationship well-being. Sexual activity and sexual health. Lifestyle, including: Alcohol, nicotine or tobacco, and drug use. Access to firearms. Diet, exercise, and sleep habits. Work and work Astronomer. Sunscreen use. Safety issues such as seatbelt and bike helmet use. Physical exam Your health care provider may check your: Height and weight. These may be used to calculate your BMI (body mass index). BMI is a measurement that tells if you are at a healthy weight. Waist circumference. This measures the distance around your waistline. This measurement also tells if you are at a healthy weight and may help predict your risk of certain diseases, such as type 2 diabetes and high blood pressure. Heart rate and blood pressure. Body temperature. Skin for abnormal spots. What immunizations do I need?  Vaccines are usually given at various ages, according to a schedule. Your health care provider will recommend vaccines for you based on your age, medical history, and lifestyle or other factors, such as travel or where you work. What tests do I need? Screening Your health care provider may recommend screening tests for certain conditions. This may include: Pelvic exam and Pap test. Lipid and cholesterol levels. Diabetes screening. This is done by checking your blood sugar (glucose) after you have not eaten for a  while (fasting). Hepatitis B test. Hepatitis C test. HIV (human immunodeficiency virus) test. STI (sexually transmitted infection) testing, if you are at risk. BRCA-related cancer screening. This may be done if you have a family history of breast, ovarian, tubal, or peritoneal cancers. Talk with your health care provider about your test results, treatment options, and if necessary, the need for more tests. Follow these instructions at home: Eating and drinking  Eat a healthy diet that includes fresh fruits and vegetables, whole grains, lean protein, and low-fat dairy products. Take vitamin and mineral supplements as recommended by your health care provider. Do not drink alcohol if: Your health care provider tells you not to drink. You are pregnant, may be pregnant, or are planning to become pregnant. If you drink alcohol: Limit how much you have to 0-1 drink a day. Know how much alcohol is in your drink. In the [place] one drink equals one 12 oz bottle of beer (355 mL), one 5 oz glass of wine (148 mL), or one 1 oz glass of hard liquor (44 mL). Lifestyle Brush your teeth every morning and night with fluoride toothpaste. Floss one time each day. Exercise for at least 30 minutes 5 or more days each week. Do not use any products that contain nicotine or tobacco. These products include cigarettes, chewing tobacco, and vaping devices, such as e-cigarettes. If you need help quitting, ask your health care provider. Do not use drugs. If you are sexually active, practice safe sex. Use a condom or other form of protection to prevent STIs. If you do not wish to become pregnant, use a form of birth control. If you plan to become pregnant, see your health care provider for a prepregnancy visit. Find healthy ways  to manage stress, such as: Meditation, yoga, or listening to music. Journaling. Talking to a trusted person. Spending time with friends and family. Minimize exposure to UV radiation to reduce  your risk of skin cancer. Safety Always wear your seat belt while driving or riding in a vehicle. Do not drive: If you have been drinking alcohol. Do not ride with someone who has been drinking. If you have been using any mind-altering substances or drugs. While texting. When you are tired or distracted. Wear a helmet and other protective equipment during sports activities. If you have firearms in your house, make sure you follow all gun safety procedures. Seek help if you have been physically or sexually abused. What's next? Go to your health care provider once a year for an annual wellness visit. Ask your health care provider how often you should have your eyes and teeth checked. Stay up to date on all vaccines. This information is not intended to replace advice given to you by your health care provider. Make sure you discuss any questions you have with your health care provider. Document Revised: 08/31/2020 Document Reviewed: 08/31/2020 Elsevier Patient Education  2024 [employer].

## 2023-04-30 NOTE — Assessment & Plan Note (Signed)
Change vyvanse to adderall xr 10mg .  She will let me know how this is working for her.

## 2023-04-30 NOTE — Addendum Note (Signed)
Addended by: Ardyth Man on: 04/30/2023 10:36 AM   Modules accepted: Orders

## 2023-04-30 NOTE — Assessment & Plan Note (Signed)
Trial of astepro.

## 2023-04-30 NOTE — Assessment & Plan Note (Signed)
Well adult Orders Placed This Encounter  Procedures   CMP14+EGFR   CBC with Differential/Platelet   Lipid Panel With LDL/HDL Ratio   Ambulatory referral to Genetics    Referral Priority:   Routine    Referral Type:   Consultation    Referral Reason:   Specialty Services Required    Number of Visits Requested:   1  Screening: Per lab orders Immunizations: Flu vaccine and prevnar 20 given today Anticipatory guidance/Risk factor reduction:  Recommendations per AVS.

## 2023-04-30 NOTE — Assessment & Plan Note (Signed)
Referral to genetics

## 2023-04-30 NOTE — Progress Notes (Signed)
April Wilkerson - 28 y.o. female MRN [id number]  Date of birth: [birth year]  Subjective Chief Complaint  Patient presents with   Annual Exam    HPI April Wilkerson is a 28 y.o. female here today for annual exam.   She reports that she is doing pretty well..  She doesn't feel that the 10mg  Vyvanse is working very well.  She had insomnia with the 20mg  strength.    Has had post nasal drainage.  May be affecting her sleep as she is snoring more.   Family history of breast cancer.  Reports that birth mother dx in her 46's.  MGM with breast cancer as well as a cousin.   Activity level is moderate.  She has been working on dietary changes.   Non-smoker.  No EtOH at this time.   Review of Systems  Constitutional:  Negative for chills, fever, malaise/fatigue and weight loss.  HENT:  Negative for congestion, ear pain and sore throat.   Eyes:  Negative for blurred vision, double vision and pain.  Respiratory:  Negative for cough and shortness of breath.   Cardiovascular:  Negative for chest pain and palpitations.  Gastrointestinal:  Negative for abdominal pain, blood in stool, constipation, heartburn and nausea.  Genitourinary:  Negative for dysuria and urgency.  Musculoskeletal:  Negative for joint pain and myalgias.  Neurological:  Negative for dizziness and headaches.  Endo/Heme/Allergies:  Does not bruise/bleed easily.  Psychiatric/Behavioral:  Negative for depression. The patient is not nervous/anxious and does not have insomnia.     No Known Allergies  Past Medical History:  Diagnosis Date   Anemia    Asthma, chronic 10/13/2013   Palpitations    Vitamin D deficiency     History reviewed. No pertinent surgical history.  Social History   Socioeconomic History   Marital status: Single    Spouse name: Not on file   Number of children: Not on file   Years of education: Not on file   Highest education level: Not on file  Occupational History   Occupation:  student    Comment: studying healthcare management at [employer]   Tobacco Use   Smoking status: Never   Smokeless tobacco: Never  Vaping Use   Vaping status: Never Used  Substance and Sexual Activity   Alcohol use: No   Drug use: Never   Sexual activity: Yes    Birth control/protection: None    Comment: none needed at the moment   Other Topics Concern   Not on file  Social History Narrative   ** Merged History Encounter **       Social Drivers of Health   Financial Resource Strain: Not on file  Food Insecurity: Not on file  Transportation Needs: Not on file  Physical Activity: Not on file  Stress: Not on file  Social Connections: Not on file    Family History  Problem Relation Age of Onset   Cancer Mother        breast cancer, not sure what age diagnosed    Hypertension Father     Health Maintenance  Topic Date Due   Pneumococcal Vaccine 51-28 Years old (1 of 2 - PCV) Never done   HIV Screening  Never done   Hepatitis C Screening  Never done   COVID-19 Vaccine (4 - 2024-25 season) 11/18/2022   INFLUENZA VACCINE  06/17/2023 (Originally 10/18/2022)   Cervical Cancer Screening (Pap smear)  11/08/2023   DTaP/Tdap/Td (7 - Td or Tdap) 05/13/2028  HPV VACCINES  Completed     ----------------------------------------------------------------------------------------------------------------------------------------------------------------------------------------------------------------- Physical Exam BP 132/85 (BP Location: Left Arm, Patient Position: Sitting, Cuff Size: Large)   Pulse 87   Ht 5\' 5"  (1.651 m)   Wt 257 lb (116.6 kg)   SpO2 100%   BMI 42.77 kg/m   Physical Exam Constitutional:      General: She is not in acute distress. HENT:     Head: Normocephalic and atraumatic.     Right Ear: Tympanic membrane and ear canal normal.     Left Ear: Tympanic membrane and ear canal normal.     Nose: Nose normal.  Eyes:     General: No scleral icterus.     Conjunctiva/sclera: Conjunctivae normal.  Neck:     Thyroid: No thyromegaly.  Cardiovascular:     Rate and Rhythm: Normal rate and regular rhythm.     Heart sounds: Normal heart sounds.  Pulmonary:     Effort: Pulmonary effort is normal.     Breath sounds: Normal breath sounds.  Abdominal:     General: Bowel sounds are normal. There is no distension.     Palpations: Abdomen is soft.     Tenderness: There is no abdominal tenderness. There is no guarding.  Musculoskeletal:        General: Normal range of motion.     Cervical back: Normal range of motion and neck supple.  Lymphadenopathy:     Cervical: No cervical adenopathy.  Skin:    General: Skin is warm and dry.     Findings: No rash.  Neurological:     General: No focal deficit present.     Mental Status: She is alert and oriented to person, place, and time.     Cranial Nerves: No cranial nerve deficit.     Coordination: Coordination normal.  Psychiatric:        Mood and Affect: Mood normal.        Behavior: Behavior normal.     ------------------------------------------------------------------------------------------------------------------------------------------------------------------------------------------------------------------- Assessment and Plan  Well adult exam Well adult Orders Placed This Encounter  Procedures   CMP14+EGFR   CBC with Differential/Platelet   Lipid Panel With LDL/HDL Ratio   Ambulatory referral to Genetics    Referral Priority:   Routine    Referral Type:   Consultation    Referral Reason:   Specialty Services Required    Number of Visits Requested:   1  Screening: Per lab orders Immunizations: Flu vaccine and prevnar 20 given today Anticipatory guidance/Risk factor reduction:  Recommendations per AVS.    ADD (attention deficit disorder) Change vyvanse to adderall xr 10mg .  She will let me know how this is working for her.   Family history of breast cancer Referral to genetics.    Allergic sinusitis Trial of astepro.    Meds ordered this encounter  Medications   amphetamine-dextroamphetamine (ADDERALL XR) 10 MG 24 hr capsule    Sig: Take 1 capsule (10 mg total) by mouth daily.    Dispense:  30 capsule    Refill:  0    No follow-ups on file.    This visit occurred during the SARS-CoV-2 public health emergency.  Safety protocols were in place, including screening questions prior to the visit, additional usage of staff PPE, and extensive cleaning of exam room while observing appropriate contact time as indicated for disinfecting solutions.

## 2023-05-01 ENCOUNTER — Telehealth: Payer: Self-pay | Admitting: Genetic Counselor

## 2023-05-01 LAB — CMP14+EGFR
ALT: 17 [IU]/L (ref 0–32)
AST: 24 [IU]/L (ref 0–40)
Albumin: 4.1 g/dL (ref 4.0–5.0)
Alkaline Phosphatase: 95 [IU]/L (ref 44–121)
BUN/Creatinine Ratio: 15 (ref 9–23)
BUN: 11 mg/dL (ref 6–20)
Bilirubin Total: 0.2 mg/dL (ref 0.0–1.2)
CO2: 25 mmol/L (ref 20–29)
Calcium: 9.6 mg/dL (ref 8.7–10.2)
Chloride: 101 mmol/L (ref 96–106)
Creatinine, Ser: 0.72 mg/dL (ref 0.57–1.00)
Globulin, Total: 3 g/dL (ref 1.5–4.5)
Glucose: 99 mg/dL (ref 70–99)
Potassium: 4.6 mmol/L (ref 3.5–5.2)
Sodium: 140 mmol/L (ref 134–144)
Total Protein: 7.1 g/dL (ref 6.0–8.5)
eGFR: 117 mL/min/{1.73_m2} (ref >59)

## 2023-05-01 LAB — CBC WITH DIFFERENTIAL/PLATELET
Basophils Absolute: 0.1 10*3/uL (ref 0.0–0.2)
Basos: 1 %
EOS (ABSOLUTE): 0.2 10*3/uL (ref 0.0–0.4)
Eos: 2 %
Hematocrit: 37.2 % (ref 34.0–46.6)
Hemoglobin: 12 g/dL (ref 11.1–15.9)
Immature Grans (Abs): 0 10*3/uL (ref 0.0–0.1)
Immature Granulocytes: 0 %
Lymphocytes Absolute: 1.9 10*3/uL (ref 0.7–3.1)
Lymphs: 27 %
MCH: 27.7 pg (ref 26.6–33.0)
MCHC: 32.3 g/dL (ref 31.5–35.7)
MCV: 86 fL (ref 79–97)
Monocytes Absolute: 0.5 10*3/uL (ref 0.1–0.9)
Monocytes: 7 %
Neutrophils Absolute: 4.5 10*3/uL (ref 1.4–7.0)
Neutrophils: 63 %
Platelets: 420 10*3/uL (ref 150–450)
RBC: 4.33 x10E6/uL (ref 3.77–5.28)
RDW: 13.5 % (ref 11.7–15.4)
WBC: 7.1 10*3/uL (ref 3.4–10.8)

## 2023-05-01 LAB — LIPID PANEL WITH LDL/HDL RATIO
Cholesterol, Total: 181 mg/dL (ref 100–199)
HDL: 47 mg/dL (ref >39)
LDL Chol Calc (NIH): 121 mg/dL — ABNORMAL HIGH (ref 0–99)
LDL/HDL Ratio: 2.6 {ratio} (ref 0.0–3.2)
Triglycerides: 68 mg/dL (ref 0–149)
VLDL Cholesterol Cal: 13 mg/dL (ref 5–40)
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
## 2023-05-07 ENCOUNTER — Encounter: Payer: Self-pay | Admitting: Family Medicine

## 2023-05-30 ENCOUNTER — Other Ambulatory Visit: Payer: Self-pay | Admitting: Family Medicine

## 2023-05-30 DIAGNOSIS — F331 Major depressive disorder, recurrent, moderate: Secondary | ICD-10-CM

## 2023-06-12 ENCOUNTER — Ambulatory Visit: Admitting: Family Medicine

## 2023-06-14 ENCOUNTER — Encounter: Payer: Self-pay | Admitting: Family Medicine

## 2023-06-14 ENCOUNTER — Ambulatory Visit: Admitting: Family Medicine

## 2023-06-14 ENCOUNTER — Ambulatory Visit

## 2023-06-14 DIAGNOSIS — M542 Cervicalgia: Secondary | ICD-10-CM

## 2023-06-14 DIAGNOSIS — M546 Pain in thoracic spine: Secondary | ICD-10-CM

## 2023-06-14 MED ORDER — NAPROXEN 500 MG PO TABS: 500.0000 mg | ORAL_TABLET | Freq: Two times a day (BID) | ORAL | 0 refills | Status: DC

## 2023-06-14 MED ORDER — CYCLOBENZAPRINE HCL 10 MG PO TABS: 10.0000 mg | ORAL_TABLET | Freq: Three times a day (TID) | ORAL | 0 refills | Status: AC | PRN

## 2023-06-14 NOTE — Telephone Encounter (Unsigned)
 Copied from CRM [id number]. Topic: Appointments - Scheduling Inquiry for Clinic >> Jun 14, 2023 10:10 AM Gildardo Pounds wrote: Reason for CRM: Patient is running late for appointment. She is 7 minutes away. Patient advised that if she is more than 10 minutes late the appointment will have to be rescheduled.

## 2023-06-14 NOTE — Assessment & Plan Note (Signed)
 Xrays of thoracic and and cervical spine ordered.  Given handout for HEP.  Adding naproxen and flexeril as needed. May use heat to the areas as well.
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
## 2023-06-25 ENCOUNTER — Ambulatory Visit: Payer: Self-pay
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
## 2023-06-26 ENCOUNTER — Encounter: Payer: Self-pay | Admitting: Family Medicine

## 2023-06-26 ENCOUNTER — Ambulatory Visit: Admitting: Family Medicine

## 2023-06-26 VITALS — BP 136/83 | HR 96 | Ht 65.0 in | Wt 254.0 lb

## 2023-06-26 DIAGNOSIS — R29898 Other symptoms and signs involving the musculoskeletal system: Secondary | ICD-10-CM | POA: Diagnosis not present

## 2023-06-26 DIAGNOSIS — M545 Low back pain, unspecified: Secondary | ICD-10-CM

## 2023-06-26 DIAGNOSIS — M542 Cervicalgia: Secondary | ICD-10-CM

## 2023-06-26 DIAGNOSIS — M546 Pain in thoracic spine: Secondary | ICD-10-CM

## 2023-06-26 NOTE — Telephone Encounter (Signed)
 Pt scheduled for an appt today, 4/9. Closing encounter.
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
## 2023-06-26 NOTE — Assessment & Plan Note (Signed)
 She has continued back and neck pain.  Continue naproxen as well as Flexeril as needed.  Adding on physical therapy.

## 2023-07-01 ENCOUNTER — Ambulatory Visit: Admitting: Family Medicine

## 2023-07-03 ENCOUNTER — Ambulatory Visit: Admitting: Rehabilitative and Restorative Service Providers"

## 2023-07-10 ENCOUNTER — Other Ambulatory Visit: Payer: Self-pay | Admitting: Family Medicine

## 2023-07-11 MED ORDER — AMPHETAMINE-DEXTROAMPHET ER 10 MG PO CP24: 10.0000 mg | ORAL_CAPSULE | Freq: Every day | ORAL | 0 refills | Status: DC

## 2023-07-11 MED ORDER — HYDROXYZINE PAMOATE 25 MG PO CAPS: 25.0000 mg | ORAL_CAPSULE | Freq: Three times a day (TID) | ORAL | 0 refills | Status: AC | PRN

## 2023-07-11 NOTE — Telephone Encounter (Signed)
 Requesting rx rf of hyroxyzine 25mg   Last written 03/30/2022 And Adderall XR 10mg  Last written 04/30/2023 Note from patient   Patient comment: Hello! I know I mentioned I wanted to try vyvanse  again but I would like to give Adderall another month to see how things work.  Last OV 06/26/2023 Upcoming appt 04/30/2024 physical

## 2023-07-19 ENCOUNTER — Encounter: Payer: Self-pay | Admitting: Genetic Counselor

## 2023-07-19 ENCOUNTER — Inpatient Hospital Stay: Payer: 59 | Admitting: Genetic Counselor

## 2023-07-19 ENCOUNTER — Inpatient Hospital Stay: Payer: 59 | Attending: Internal Medicine

## 2023-07-19 ENCOUNTER — Telehealth: Payer: Self-pay | Admitting: Family Medicine

## 2023-07-19 DIAGNOSIS — Z800 Family history of malignant neoplasm of digestive organs: Secondary | ICD-10-CM

## 2023-07-19 DIAGNOSIS — Z803 Family history of malignant neoplasm of breast: Secondary | ICD-10-CM

## 2023-07-19 DIAGNOSIS — Z1379 Encounter for other screening for genetic and chromosomal anomalies: Secondary | ICD-10-CM | POA: Diagnosis not present

## 2023-07-19 DIAGNOSIS — Z1509 Genetic susceptibility to other malignant neoplasm: Secondary | ICD-10-CM

## 2023-07-19 DIAGNOSIS — Z1501 Genetic susceptibility to malignant neoplasm of breast: Secondary | ICD-10-CM

## 2023-07-19 LAB — GENETIC SCREENING ORDER

## 2023-07-19 NOTE — Progress Notes (Signed)
 REFERRING PROVIDER: Adela Holter, DO [address] [place],  [place] [address]  PRIMARY PROVIDER:  Adela Holter, DO  PRIMARY REASON FOR VISIT:  1. Family history of malignant neoplasm of breast   2. Family history of malignant neoplasm of gastrointestinal tract      HISTORY OF PRESENT ILLNESS:   Ms. Keady, a 28 y.o. female, was seen for a [facility] cancer genetics consultation at the request of Dr. Augustus Ledger due to a family history of cancer.  Ms. Tor presents to clinic today to discuss the possibility of a hereditary predisposition to cancer, genetic testing, and to further clarify her future cancer risks, as well as potential cancer risks for family members.   Ms. Lintz is a 28 y.o. female with no personal history of cancer.    RELEVANT MEDICAL HISTORY:  Menarche was at age 45.  Nulliparous.  OCP use for approximately 0 years.  Ovaries intact: yes.  Hysterectomy: no.  Menopausal status: premenopausal.  HRT use: 0 years. Colonoscopy: no; not examined. Mammogram within the last year: no. Number of breast biopsies: 0. Up to date with pelvic exams: yes. Any excessive radiation exposure in the past: no  Past Medical History:  Diagnosis Date   Anemia    Asthma, chronic 10/13/2013   Palpitations    Vitamin D  deficiency     No past surgical history on file.  Social History   Socioeconomic History   Marital status: Single    Spouse name: Not on file   Number of children: Not on file   Years of education: Not on file   Highest education level: Not on file  Occupational History   Occupation: student    Comment: studying Development worker, community at [employer]   Tobacco Use   Smoking status: Never   Smokeless tobacco: Never  Vaping Use   Vaping status: Never Used  Substance and Sexual Activity   Alcohol use: No   Drug use: Never   Sexual activity: Yes    Birth control/protection: None    Comment: none needed at the moment   Other Topics  Concern   Not on file  Social History Narrative   ** Merged History Encounter **       Social Drivers of Corporate investment banker Strain: Not on file  Food Insecurity: Not on file  Transportation Needs: Not on file  Physical Activity: Not on file  Stress: Not on file  Social Connections: Not on file     FAMILY HISTORY:  We obtained a detailed, 4-generation family history.  Significant diagnoses are listed below: Family History  Problem Relation Age of Onset   Cancer Mother 78 - 58   Hypertension Father    Colon cancer Other        matenral great aunt   Breast cancer Maternal Great-grandmother 44 - 57   Breast cancer Cousin 74 - 75       maternal first cousin once removed    Ms. Purdom reports her maternal aunt (adoptive mother) had genetic testing that was negative (unsure of specific test, believes a variant of uncertain significance identified). She does not report any additional family history of genetic testing for hereditary cancer risks. There is no reported Ashkenazi Jewish ancestry.   Ms. Ciccio reports her biologic mother was diagnosed with breast cancer in her early 47s and passed away at age 65. She reports a maternal great aunt diagnosed with colon cancer, whose daughter was diagnosed with  breast cancer in her 67s and her 22s, passed away in her 46s. She reports her maternal great-grandmother was diagnosed with breast cancer in her 60s, passed away in her early 75s.      GENETIC COUNSELING ASSESSMENT: Ms. Lenington is a 28 y.o. female with a family history of cancer which is suggestive of an inherited predisposition to cancer given her family history of her biologic mother diagnosed with breast cancer under the age of 17. We, therefore, discussed and recommended the following at today's visit.   DISCUSSION: We discussed that, in general, most cancer is not inherited in families, but instead is sporadic or familial. Sporadic cancers occur by chance and typically happen  at older ages (>50 years) as this type of cancer is caused by genetic changes acquired during an individual's lifetime. Some families have more cancers than would be expected by chance; however, the ages or types of cancer are not consistent with a known genetic mutation or known genetic mutations have been ruled out. This type of familial cancer is thought to be due to a combination of multiple genetic, environmental, hormonal, and lifestyle factors. While this combination of factors likely increases the risk of cancer, the exact source of this risk is not currently identifiable or testable.  We discussed that 5 - 10% of cancer is hereditary. We discussed that testing is beneficial for several reasons including knowing how to screen individuals for cancer, identify preventative treatments, and to understand if other family members could be at risk for cancer and allow them to undergo genetic testing.   We reviewed the characteristics, features and inheritance patterns of hereditary cancer syndromes. We also discussed genetic testing, including the appropriate family members to test, the process of testing, insurance coverage and turn-around-time for results. We discussed the implications of a negative, positive, carrier and/or variant of uncertain significant result. Ms. Canion  was offered a common hereditary cancer panel (36+ genes) and an expanded pan-cancer panel (70+ genes). Ms. Brandstetter was informed of the benefits and limitations of each panel, including that expanded pan-cancer panels contain genes that do not have clear management guidelines at this point in time.  We also discussed that as the number of genes included on a panel increases, the chances of variants of uncertain significance increases. Ms. Jarmin decided to pursue genetic testing for the 40 gene panel. The [facility]Next+RNAinsight Panel includes sequencing, rearrangement analysis, and RNA analysis for the following 39 genes: APC, ATM,  BAP1, BARD1, BMPR1A, BRCA1, BRCA2, BRIP1, CDH1, CDKN2A, CHEK2, FH, FLCN, MET, MLH1, MSH2, MSH6, MUTYH, NF1, NTHL1, PALB2, PMS2, PTEN, RAD51C, RAD51D, RPS20, SMAD4, STK11, TP53, TSC1, TSC2, and VHL (sequencing and deletion/duplication); AXIN2, HOXB13, MBD4, MSH3, POLD1 and POLE (sequencing only); EPCAM and GREM1 (deletion/duplication only).   Based on Ms. Meech's family history of cancer, she meets medical criteria for genetic testing. Though Ms. Boeh is not personally affected, there are no affected family members that are willing/able/available to undergo hereditary cancer testing.  Therefore, Ms. Thomasis the most informative family member available.  Despite that she meets criteria, she may still have an out of pocket cost. We discussed that if her out of pocket cost for testing is over $100, the laboratory will call and confirm whether she wants to proceed with testing.  If the out of pocket cost of testing is less than $100 she will be billed by the genetic testing laboratory.   We discussed that some people do not want to undergo genetic testing due to fear  of genetic discrimination.  The Genetic Information Nondiscrimination Act (GINA) was signed into federal law in 2008. GINA prohibits health insurers and most employers from discriminating against individuals based on genetic information (including the results of genetic tests and family history information). According to GINA, health insurance companies cannot consider genetic information to be a preexisting condition, nor can they use it to make decisions regarding coverage or rates. GINA also makes it illegal for most employers to use genetic information in making decisions about hiring, firing, promotion, or terms of employment. It is important to note that GINA does not offer protections for life insurance, disability insurance, or long-term care insurance. GINA does not apply to those in the [employer], those who work for companies with less than  15 employees, and new life insurance or long-term disability insurance policies.  Health status due to a cancer diagnosis is not protected under GINA. More information about GINA can be found by visiting [email or web address].  The Tyrer-Cuzick model is one of multiple prediction models developed to estimate an individual's lifetime risk of developing breast cancer. The Tyrer-Cuzick model is endorsed by the [employer] (NCCN). This model includes many risk factors such as family history, endogenous estrogen exposure, and benign breast disease. The calculation is highly-dependent on the accuracy of clinical data provided by the patient and can change over time. The Tyrer-Cuzick model may be repeated to reflect new information in her personal or family history in the future.   PLAN: After considering the risks, benefits, and limitations, Ms. Galinato provided informed consent to pursue genetic testing and the blood sample was sent to [facility] for analysis of the [facility]. Results should be available within approximately 2-3 weeks' time, at which point they will be disclosed by telephone to Ms. Clouser, as will any additional recommendations warranted by these results. Ms. Heming will receive a summary of her genetic counseling visit and a copy of her results once available. This information will also be available in Epic.   Lastly, we encouraged Ms. Hardmon to remain in contact with cancer genetics annually so that we can continuously update the family history and inform her of any changes in cancer genetics and testing that may be of benefit for this family.   Ms. Ardinger questions were answered to her satisfaction today. Our contact information was provided should additional questions or concerns arise. Thank you for the referral and allowing us  to share in the care of your patient.   Jobie Mulders, MS, [facility] Licensed, Radio [email or web address] phone: [phone]  60 minutes were spent on the date of the encounter in service to the patient including preparation, face-to-face consultation, documentation and care coordination.  The patient brought her mother to the appointment. Drs. Johnna Nakai, and/or Gudena were available for questions, if needed.  _______________________________________________________________________ For Office Staff:  Number of people involved in session: 2 Was an Intern/ student involved with case: yes. [employer] Intern Candace Teague provided counseling in this visit with direct supervision by me.

## 2023-07-19 NOTE — Telephone Encounter (Unsigned)
 Copied from CRM [id number]. Topic: Appointments - Scheduling Inquiry for Clinic >> Jul 18, 2023  4:56 PM [employer] wrote: Patient states she needs physical therapy and her insurance recommended [facility]. I am unable to schedule an appointment with her.  Callback #: [phone]
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
## 2023-08-07 ENCOUNTER — Telehealth: Payer: Self-pay | Admitting: Genetic Counselor

## 2023-08-07 ENCOUNTER — Ambulatory Visit: Payer: Self-pay | Admitting: Genetic Counselor

## 2023-08-07 DIAGNOSIS — Z9189 Other specified personal risk factors, not elsewhere classified: Secondary | ICD-10-CM

## 2023-08-07 DIAGNOSIS — Z1379 Encounter for other screening for genetic and chromosomal anomalies: Secondary | ICD-10-CM | POA: Insufficient documentation

## 2023-08-07 NOTE — Progress Notes (Signed)
 HPI:  Ms. Alkhatib was previously seen in the [facility] due to a family history of cancer and concerns regarding a hereditary predisposition to cancer. Please refer to our prior cancer genetics clinic note for more information regarding our discussion, assessment and recommendations, at the time. Ms. Krist's recent genetic test results were disclosed to her, as were recommendations warranted by these results. These results and recommendations are discussed in more detail below.  FAMILY HISTORY:  We obtained a detailed, 4-generation family history.  Significant diagnoses are listed below: Family History  Problem Relation Age of Onset   Cancer Mother 62 - 75   Hypertension Father    Colon cancer Other        matenral great aunt   Breast cancer Maternal Great-grandmother 26 - 13   Breast cancer Cousin 50 - 34       maternal first cousin once removed    Ms. Radziewicz reports her maternal aunt (adoptive mother) had genetic testing that was negative (unsure of specific test, believes a variant of uncertain significance identified). She does not report any additional family history of genetic testing for hereditary cancer risks. There is no reported Ashkenazi Jewish ancestry.    Ms. Pomplun reports her biologic mother was diagnosed with breast cancer in her early 50s and passed away at age 26. She reports a maternal great aunt diagnosed with colon cancer, whose daughter was diagnosed with breast cancer in her 77s and her 9s, passed away in her 31s. She reports her maternal great-grandmother was diagnosed with breast cancer in her 63s, passed away in her early 59s.      GENETIC TEST RESULTS: Genetic testing reported out on 08/03/23 through the [facility] +RNAinsight panel found no pathogenic mutations. The [facility]Next+RNAinsight Panel includes sequencing, rearrangement analysis, and RNA analysis for the following 40 genes: APC, ATM, BAP1, BARD1, BMPR1A, BRCA1, BRCA2,  BRIP1, CDH1, CDKN2A, CHEK2, FH, FLCN, MET, MLH1, MSH2, MSH6, MUTYH, NF1, NTHL1, PALB2, PMS2, PTEN, RAD51C, RAD51D, RPS20, SMAD4, STK11, TP53, TSC1, TSC2 and VHL (sequencing and deletion/duplication); AXIN2, HOXB13, MBD4, MSH3, POLD1 and POLE (sequencing only); EPCAM and GREM1 (deletion/duplication only). The test report has been scanned into EPIC and is located under the Molecular Pathology section of the Results Review tab. A section of the report is included below.     We discussed with Ms. Dickison that because current genetic testing is not perfect, it is possible there may be a gene mutation in one of these genes that current testing cannot detect, but that chance is small.  We also discussed, that there could be another gene that has not yet been discovered, or that we have not yet tested, that is responsible for the cancer diagnoses in the family. It is also possible there is a hereditary cause for the cancer in the family that Ms. Coonrod did not inherit and therefore was not identified in her testing.  Therefore, it is important to remain in touch with cancer genetics in the future so that we can continue to offer Ms. Roberti the most up to date genetic testing.   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  At this time, it is unknown if this variant is associated with increased cancer risk or if this is a normal finding, but most variants such as this get reclassified to being inconsequential. It should not be used to make medical management decisions. With time, we  suspect the lab will determine the significance of this variant, if any. If we do learn more about it, we will try to contact Ms. Kuck to discuss it further. However, it is important to stay in touch with us  periodically and keep the address and phone number up to date.  ADDITIONAL GENETIC TESTING: We discussed with Ms. Fewell that there are other  genes that are associated with increased cancer risk that can be analyzed. Should Ms. Grippi wish to pursue additional genetic testing, we are happy to discuss and coordinate this testing, at any time.    CANCER SCREENING RECOMMENDATIONS: Ms. Feng test result is considered negative (normal).  This means that we have not identified a hereditary cause for her family history of cancer at this time. Most cancers happen by chance and this negative test suggests that her family history of cancer may fall into this category.    Possible reasons for Ms. Gellis's negative genetic test include:  1. There may be a gene mutation in one of these genes that current testing methods cannot detect but that chance is small.  2. There could be another gene that has not yet been discovered, or that we have not yet tested, that is responsible for the cancer diagnoses in the family.  3.  There may be no hereditary risk for cancer in the family. The cancers in Ms. Pegg and/or her family may be sporadic/familial or due to other genetic and environmental factors. 4. It is also possible there is a hereditary cause for the cancer in the family that Ms. Preiss did not inherit.  Therefore, it is recommended she continue to follow the cancer management and screening guidelines provided by her primary healthcare provider. An individual's cancer risk and medical management are not determined by genetic test results alone. Overall cancer risk assessment incorporates additional factors, including personal medical history, family history, and any available genetic information that may result in a personalized plan for cancer prevention and surveillance  Given Ms. Ogle's family histories, we must interpret these negative results with some caution.  Families with features suggestive of hereditary risk for cancer tend to have multiple family members with cancer, diagnoses in multiple generations and diagnoses before the age of 33.  Ms. Bingman's family exhibits some of these features. Thus, this result may simply reflect our current inability to detect all mutations within these genes or there may be a different gene that has not yet been discovered or tested.   An individual's cancer risk and medical management are not determined by genetic test results alone. Overall cancer risk assessment incorporates additional factors, including personal medical history, family history, and any available genetic information that may result in a personalized plan for cancer prevention and surveillance.  Based on Ms. Veselka's family of cancer, as well as her genetic test results, statistical models Barton Bos)  were used to estimate her risk of developing breast cancer. This estimates her lifetime risk of developing breast cancer to be approximately 23.9%.  The patient's lifetime breast cancer risk is a preliminary estimate based on available information using one of several models endorsed by the [employer] (NCCN).  The NCCN recommends consideration of breast MRI screening as an adjunct to mammography for patients at high risk (defined as 20% or greater lifetime risk). Please note that a woman's breast cancer risk changes over time. It may increase or decrease based on age and any changes to the personal and/or family medical history. The risks and  recommendations listed above apply to this patient at this point in time. In the future, she may or may not be eligible for the same medical management strategies and, in some cases, other medical management strategies may become available to her. If she is interested in an updated breast cancer risk assessment at a later date, she can contact us .   For women with a greater than 20% lifetime risk of breast cancer, the [employer] (NCCN) recommends the following:  Clinical encounter every 6-12 months to begin when identified as being at increased risk,  but not before age 10  Annual mammograms. Tomosynthesis is recommended starting 10 years earlier than the youngest breast cancer diagnosis in the family or at age 68 (whichever comes first), but not before age 79   Annual breast MRI starting 10 years earlier than the youngest breast cancer diagnosis in the family or at age 75 (whichever comes first), but not before age 67.     We, therefore, discussed that it is reasonable for Ms. Kawahara to be followed by a high-risk breast cancer clinic; in addition to a yearly mammogram and physical exam by a healthcare provider, she should discuss the usefulness of an annual breast MRI with the high-risk clinic providers.  RECOMMENDATIONS FOR FAMILY MEMBERS:  Individuals in this family might be at some increased risk of developing cancer, over the general population risk, simply due to the family history of cancer.  We recommended women in this family have a yearly mammogram beginning at age 36, or 56 years younger than the earliest onset of cancer, an annual clinical breast exam, and perform monthly breast self-exams. Additionally, they should consider a breast cancer risk assessment to determine if they would qualify for high risk surveillance. Women in this family should also have a gynecological exam as recommended by their primary provider. All family members should be referred for colonoscopy starting at age 73, or 75 years younger than the earliest onset of cancer.  FOLLOW-UP: Lastly, we discussed with Ms. Venturini that cancer genetics is a rapidly advancing field and it is possible that new genetic tests will be appropriate for her and/or her family members in the future. We encouraged her to remain in contact with cancer genetics on an annual basis so we can update her personal and family histories and let her know of advances in cancer genetics that may benefit this family.   Our contact number was provided. Ms. Bouchard's questions were answered to her  satisfaction, and she knows she is welcome to call us  at anytime with additional questions or concerns.   Jobie Mulders, MS, [facility] Licensed, Retail [email or web address] [phone]

## 2023-08-07 NOTE — Telephone Encounter (Signed)
 I spoke to April Wilkerson to review results of genetic testing. she had genetic testing with [facility] +RNAinsight panel. 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 We discussed that we do not use VUS to make management decisions or identify at risk family members. Discussed that we do not know why there is cancer in the family, so still important to utilize family history when making decisions about cancer screening and prevention. It could be due to a different gene that we are not testing, or maybe our current technology may not be able to pick something up.  It will be important for her to keep in contact with genetics to keep up with whether additional testing may be needed. We will contact her if new information is learned about the VUS.   Discussed TC lifetime risk 23.9%, interested in referral to High Risk Breast Clinic.   Please see counseling note for further detail on this result.

## 2023-09-04 ENCOUNTER — Encounter: Payer: Self-pay | Admitting: Family Medicine

## 2023-09-04 MED ORDER — LISDEXAMFETAMINE DIMESYLATE 10 MG PO CAPS: 10.0000 mg | ORAL_CAPSULE | Freq: Every day | ORAL | 0 refills | Status: DC

## 2023-09-19 ENCOUNTER — Encounter: Payer: Self-pay | Admitting: *Deleted

## 2023-09-19 ENCOUNTER — Inpatient Hospital Stay: Admitting: Hematology and Oncology

## 2023-09-19 NOTE — Progress Notes (Signed)
 Pt no show for appt today.  Message sent to new pt schedule to re-schedule missed appt.

## 2023-09-30 ENCOUNTER — Telehealth: Payer: Self-pay | Admitting: Hematology and Oncology

## 2023-09-30 NOTE — Telephone Encounter (Signed)
 Called to reschedule the patients appointment due to provider PAL. Left VM for the patient to call back to reschedule.

## 2023-10-04 MED ORDER — AMPHETAMINE-DEXTROAMPHET ER 20 MG PO CP24: 20.0000 mg | ORAL_CAPSULE | ORAL | 0 refills | Status: DC

## 2023-10-04 NOTE — Addendum Note (Signed)
 Addended by: ALVIA VELMA BRAVO on: 10/04/2023 12:03 PM   Modules accepted: Orders

## 2023-10-14 ENCOUNTER — Inpatient Hospital Stay: Admitting: Hematology and Oncology

## 2023-10-21 ENCOUNTER — Inpatient Hospital Stay: Attending: Internal Medicine | Admitting: Hematology and Oncology

## 2023-12-17 ENCOUNTER — Encounter: Payer: Self-pay | Admitting: Family Medicine

## 2023-12-18 NOTE — Telephone Encounter (Signed)
 Please see attached message from patient.  Requesting rx rf of Adderall XR 20mg    Last written 07/182025 Last OV 06/26/2023 Upcoming appt 04/30/2024 physical

## 2023-12-20 MED ORDER — AMPHETAMINE-DEXTROAMPHET ER 20 MG PO CP24: 20.0000 mg | ORAL_CAPSULE | ORAL | 0 refills | Status: DC

## 2024-01-24 ENCOUNTER — Other Ambulatory Visit: Payer: Self-pay | Admitting: Family Medicine

## 2024-01-24 DIAGNOSIS — F331 Major depressive disorder, recurrent, moderate: Secondary | ICD-10-CM

## 2024-01-28 ENCOUNTER — Ambulatory Visit: Payer: Self-pay

## 2024-01-28 ENCOUNTER — Ambulatory Visit: Admitting: Urgent Care

## 2024-01-28 ENCOUNTER — Encounter: Payer: Self-pay | Admitting: Urgent Care

## 2024-01-28 VITALS — BP 120/79 | HR 122 | Temp 101.2°F | Ht 65.0 in | Wt 258.0 lb

## 2024-01-28 DIAGNOSIS — J111 Influenza due to unidentified influenza virus with other respiratory manifestations: Secondary | ICD-10-CM | POA: Diagnosis not present

## 2024-01-28 LAB — POC COVID19/FLU A&B COMBO
Covid Antigen, POC: NEGATIVE
Influenza A Antigen, POC: NEGATIVE
Influenza B Antigen, POC: NEGATIVE

## 2024-01-28 MED ORDER — XOFLUZA (80 MG DOSE) 1 X 80 MG PO TBPK: 1.0000 | ORAL_TABLET | Freq: Once | ORAL | 0 refills | Status: AC

## 2024-01-28 NOTE — Telephone Encounter (Signed)
 FYI Only or Action Required?: FYI only for provider: appointment scheduled on This morning.  Patient was last seen in primary care on 06/26/2023 by Alvia Bring, DO.  Called Nurse Triage reporting Shortness of Breath  - asthma + s/s of URI  Symptoms began yesterday.  Interventions attempted: Other: inhaler.  Symptoms are: gradually worsening.  Triage Disposition: See HCP Within 4 Hours (Or PCP Triage)  Patient/caregiver understands and will follow disposition?: Yes                           Copied from CRM [id number]. Topic: Clinical - Red Word Triage >> Jan 28, 2024  8:16 AM Mia F wrote: Red Word that prompted transfer to Nurse Triage: Having a hard time breathing. She says she has tried using her inhaler. It worked for a little while but goes back to having troubles. Started yesterday evening she took OTC meds but overnight and this morning it got worse. She is experiencing chest discomfort and wheezing. Also has a cough that causes a sharp pain in her chest. She does have asthma. Reason for Disposition  [1] MILD difficulty breathing (e.g., minimal/no SOB at rest, SOB with walking, pulse < 100) AND [2] NEW-onset or WORSE than normal  Answer Assessment - Initial Assessment Questions 1. RESPIRATORY STATUS: Describe your breathing? (e.g., wheezing, shortness of breath, unable to speak, severe coughing)      Can't get a full breath 2. ONSET: When did this breathing problem begin?      Last night 3. PATTERN Does the difficult breathing come and go, or has it been constant since it started?      Gotten worse 4. SEVERITY: How bad is your breathing? (e.g., mild, moderate, severe)      Moderate 5. RECURRENT SYMPTOM: Have you had difficulty breathing before? If Yes, ask: When was the last time? and What happened that time?      Yes - seasonal 6. CARDIAC HISTORY: Do you have any history of heart disease? (e.g., heart attack, angina, bypass surgery,  angioplasty)       Along times ago - tachycardia 7. LUNG HISTORY: Do you have any history of lung disease?  (e.g., pulmonary embolus, asthma, emphysema)     Asthma 8. CAUSE: What do you think is causing the breathing problem?      Asthma + cold/flu_  9. OTHER SYMPTOMS: Do you have any other symptoms? (e.g., chest pain, cough, dizziness, fever, runny nose)     Fever, cough - sharp pain with cough, Congestion sinus. HA all yesterday, nausea.  11. PREGNANCY: Is there any chance you are pregnant? When was your last menstrual period?       no  Protocols used: Breathing Difficulty-A-AH

## 2024-01-28 NOTE — Patient Instructions (Signed)
 Your covid and flu test are negative. However, I would like to treat you for flu given your symtoms.  Please take 800mg  ibuprofen alternating with 1000mg  tylenol  every 4 hours. This will help with body aches, headache and help break the fever.  Please maintain hydration - your fluid needs increase dramatically when you have a fever. Please drink WATER!  Purchase OTC Oscillococcinum. This helps with body aches. Consider OTC Quercetin 500mg  with zinc 50mg  -this boosts your bodies own natural immunity.   Take xofluza x 1 dose.  If you still have symptoms in 48 hours return for a recheck.

## 2024-01-28 NOTE — Progress Notes (Signed)
 Established Patient Office Visit  Subjective:  Patient ID: April Wilkerson, female    DOB: Nov 27, 1995  Age: 28 y.o. MRN: [id number]  Chief Complaint  Patient presents with   Cough    With SOB    Cough    Discussed the use of AI scribe software for clinical note transcription with the patient, who gave verbal consent to proceed.  History of Present Illness   April Wilkerson Record is a 28 year old female with asthma who presents with worsening cough and shortness of breath.  Symptoms began yesterday with a cough and nausea, followed by the development of shortness of breath later in the day. She initially thought it was her asthma acting up and used her inhaler before bed, but it did not provide much relief. She woke up around 4:00 to 5:00 AM to use the inhaler again, but it still did not help her breathe well.  Today, she continues to experience the cough and notes that the shortness of breath is worsening. The cough is mostly dry with some clear mucus. She also reports generalized body aches and a headache, for which she took Alka-Seltzer, finding some relief. She can still taste and smell.  No chest pain except for occasional pain when coughing. Current medication includes albuterol  for her asthma.       Patient Active Problem List   Diagnosis Date Noted   Genetic testing 08/07/2023   Acute right-sided thoracic back pain 06/14/2023   Neck pain 06/14/2023   URI (upper respiratory infection) 01/15/2023   ADD (attention deficit disorder) 12/28/2022   Lower extremity edema 10/14/2022   LLQ abdominal pain 08/01/2022   Well adult exam 02/23/2022   Major depressive disorder, recurrent episode, moderate (HCC) 11/08/2021   Adjustment disorder with mixed anxiety and depressed mood 08/12/2021   Allergic sinusitis 08/08/2021   Acute cough 08/08/2021   Frequent headaches 04/26/2021   Dizziness 04/26/2021   Neck tightness 04/26/2021   ETD (Eustachian tube dysfunction),  bilateral 04/26/2021   Anxiety state 12/12/2020   Constipation 10/28/2014   Anemia 10/15/2014   Hypokalemia 10/15/2014   Family history of breast cancer 10/14/2014   Acne 02/15/2014   Chronic asthma 10/13/2013   Faintness 10/13/2013   Past Medical History:  Diagnosis Date   Anemia    Asthma, chronic 10/13/2013   Palpitations    Vitamin D  deficiency    History reviewed. No pertinent surgical history. Social History   Tobacco Use   Smoking status: Never   Smokeless tobacco: Never  Vaping Use   Vaping status: Never Used  Substance Use Topics   Alcohol use: No   Drug use: Never      ROS: as noted in HPI  Objective:     BP 120/79   Pulse (!) 122   Temp (!) 101.2 F (38.4 C) (Oral)   Ht 5' 5 (1.651 m)   Wt 258 lb (117 kg)   SpO2 100%   BMI 42.93 kg/m  Wt Readings from Last 3 Encounters:  01/28/24 258 lb (117 kg)  06/26/23 254 lb (115.2 kg)  06/14/23 250 lb (113.4 kg)    Physical Exam Vitals and nursing note reviewed. Exam conducted with a chaperone present.  Constitutional:      General: She is not in acute distress.    Appearance: Normal appearance. She is ill-appearing. She is not toxic-appearing or diaphoretic.  HENT:     Head: Normocephalic and atraumatic.     Right Ear: Ear canal and  external ear normal. A middle ear effusion is present. There is no impacted cerumen.     Left Ear: Ear canal and external ear normal. A middle ear effusion is present. There is no impacted cerumen.     Nose: Congestion and rhinorrhea present.     Mouth/Throat:     Mouth: Mucous membranes are moist.     Pharynx: Oropharynx is clear. Posterior oropharyngeal erythema present. No oropharyngeal exudate.  Eyes:     General:        Right eye: No discharge.        Left eye: No discharge.     Extraocular Movements: Extraocular movements intact.     Conjunctiva/sclera: Conjunctivae normal.     Pupils: Pupils are equal, round, and reactive to light.  Cardiovascular:     Rate  and Rhythm: Regular rhythm. Tachycardia present.  Pulmonary:     Effort: Pulmonary effort is normal. No respiratory distress.     Breath sounds: Normal breath sounds. No stridor. No wheezing, rhonchi or rales.  Chest:     Chest wall: No tenderness.  Musculoskeletal:     Cervical back: Normal range of motion and neck supple.  Lymphadenopathy:     Cervical: No cervical adenopathy.  Skin:    General: Skin is warm and dry.     Coloration: Skin is not jaundiced.     Findings: No bruising, erythema or rash.  Neurological:     General: No focal deficit present.     Mental Status: She is alert and oriented to person, place, and time.     Gait: Gait normal.  Psychiatric:        Mood and Affect: Mood normal.      Results for orders placed or performed in visit on 01/28/24  POC Covid19/Flu A&B Antigen  Result Value Ref Range   Influenza A Antigen, POC Negative Negative   Influenza B Antigen, POC Negative Negative   Covid Antigen, POC Negative Negative     The ASCVD Risk score (Arnett DK, et al., 2019) failed to calculate for the following reasons:   The 2019 ASCVD risk score is only valid for ages 15 to 22  Assessment & Plan:  Influenza-like illness -     POC Covid19/Flu A&B Antigen -     Xofluza (80 MG Dose); Take 1 tablet by mouth once for 1 dose.  Dispense: 1 each; Refill: 0  Pt presents with acute URI sx. Flu-like sx started abruptly this AM, therefore I suspect a false negative test due to timeframe from sx onset. Lungs CTA, do not suspect pneumonia. Will call in xofluza, pt aware of $50 copay card online. Fluids, rest, antipyretics and OTC supportive agents.   No follow-ups on file.   Benton LITTIE Gave, PA

## 2024-01-30 ENCOUNTER — Encounter: Payer: Self-pay | Admitting: Urgent Care

## 2024-01-30 DIAGNOSIS — R0789 Other chest pain: Secondary | ICD-10-CM

## 2024-01-30 DIAGNOSIS — R051 Acute cough: Secondary | ICD-10-CM

## 2024-01-31 ENCOUNTER — Ambulatory Visit

## 2024-01-31 ENCOUNTER — Other Ambulatory Visit: Payer: Self-pay | Admitting: Family Medicine

## 2024-01-31 ENCOUNTER — Ambulatory Visit: Payer: Self-pay | Admitting: Urgent Care

## 2024-01-31 DIAGNOSIS — R0602 Shortness of breath: Secondary | ICD-10-CM | POA: Diagnosis not present

## 2024-01-31 DIAGNOSIS — R051 Acute cough: Secondary | ICD-10-CM | POA: Diagnosis not present

## 2024-01-31 DIAGNOSIS — R0789 Other chest pain: Secondary | ICD-10-CM

## 2024-03-03 ENCOUNTER — Encounter: Payer: Self-pay | Admitting: Family Medicine

## 2024-03-03 ENCOUNTER — Telehealth: Admitting: Family Medicine

## 2024-03-03 VITALS — Ht 65.0 in | Wt 258.0 lb

## 2024-03-03 DIAGNOSIS — N939 Abnormal uterine and vaginal bleeding, unspecified: Secondary | ICD-10-CM

## 2024-03-03 DIAGNOSIS — D485 Neoplasm of uncertain behavior of skin: Secondary | ICD-10-CM | POA: Insufficient documentation

## 2024-03-03 MED ORDER — NAPROXEN 500 MG PO TABS: 500.0000 mg | ORAL_TABLET | Freq: Two times a day (BID) | ORAL | 0 refills | Status: AC

## 2024-03-03 NOTE — Progress Notes (Unsigned)
 Heavy flow saturating clothes.  Cycle start: 12/11 Cycle end: N/A

## 2024-03-07 LAB — CMP14+EGFR
ALT: 12 IU/L (ref 0–32)
AST: 17 IU/L (ref 0–40)
Albumin: 4.3 g/dL (ref 4.0–5.0)
Alkaline Phosphatase: 92 IU/L (ref 41–116)
BUN/Creatinine Ratio: 15 (ref 9–23)
BUN: 12 mg/dL (ref 6–20)
Bilirubin Total: 0.3 mg/dL (ref 0.0–1.2)
CO2: 24 mmol/L (ref 20–29)
Calcium: 9.8 mg/dL (ref 8.7–10.2)
Chloride: 101 mmol/L (ref 96–106)
Creatinine, Ser: 0.79 mg/dL (ref 0.57–1.00)
Globulin, Total: 2.9 g/dL (ref 1.5–4.5)
Glucose: 86 mg/dL (ref 70–99)
Potassium: 4.4 mmol/L (ref 3.5–5.2)
Sodium: 139 mmol/L (ref 134–144)
Total Protein: 7.2 g/dL (ref 6.0–8.5)
eGFR: 104 mL/min/1.73

## 2024-03-07 LAB — CBC WITH DIFFERENTIAL/PLATELET
Basophils Absolute: 0.1 x10E3/uL (ref 0.0–0.2)
Basos: 1 %
EOS (ABSOLUTE): 0.1 x10E3/uL (ref 0.0–0.4)
Eos: 2 %
Hematocrit: 33.4 % — ABNORMAL LOW (ref 34.0–46.6)
Hemoglobin: 10.7 g/dL — ABNORMAL LOW (ref 11.1–15.9)
Immature Grans (Abs): 0 x10E3/uL (ref 0.0–0.1)
Immature Granulocytes: 0 %
Lymphocytes Absolute: 2.1 x10E3/uL (ref 0.7–3.1)
Lymphs: 29 %
MCH: 26.8 pg (ref 26.6–33.0)
MCHC: 32 g/dL (ref 31.5–35.7)
MCV: 84 fL (ref 79–97)
Monocytes Absolute: 0.5 x10E3/uL (ref 0.1–0.9)
Monocytes: 7 %
Neutrophils Absolute: 4.5 x10E3/uL (ref 1.4–7.0)
Neutrophils: 61 %
Platelets: 454 x10E3/uL — ABNORMAL HIGH (ref 150–450)
RBC: 4 x10E6/uL (ref 3.77–5.28)
RDW: 14.5 % (ref 11.7–15.4)
WBC: 7.3 x10E3/uL (ref 3.4–10.8)

## 2024-03-07 LAB — TSH: TSH: 1.04 u[IU]/mL (ref 0.450–4.500)

## 2024-03-08 NOTE — Progress Notes (Signed)
 " April Wilkerson - 28 y.o. female MRN [id number]  Date of birth: Jul 08, 1995   All issues noted in this document were discussed and addressed.  No physical exam was performed (except for noted visual exam findings with Video Visits).  I discussed the limitations of evaluation and management by telemedicine and the availability of in person appointments. The patient expressed understanding and agreed to proceed.  I connected withNAME@ on 03/08/2024 at  3:10 PM EST by a video enabled telemedicine application and verified that I am speaking with the correct person using two identifiers.  Present at visit: Velma Ku, DO [name]   Patient Location: Home [address] [address] [place] [address]   Provider location:   [facility]  Chief Complaint  Patient presents with   Menstrual Problem    HPI  April Wilkerson is a 28 y.o. female who presents via audio/video conferencing for a telehealth visit today.  Concerns about heavy menstrual period.  Menstrual periods are typically regular pretty normal with moderate flow and normal duration.  Recently had heavier and longer menstrual period than normal.  Cramps have been worse as well.  She was soaking through a heavy flow pad and through her clothes.  This has improved as of today however was concerned due to this being abnormal.   ROS:  A comprehensive ROS was completed and negative except as noted per HPI  Past Medical History:  Diagnosis Date   Anemia    Asthma, chronic 10/13/2013   Palpitations    Vitamin D  deficiency     History reviewed. No pertinent surgical history.  Family History  Problem Relation Age of Onset   Cancer Mother 52 - 38   Hypertension Father    Colon cancer Other        matenral great aunt   Breast cancer Maternal Great-grandmother 86 - 99   Breast cancer Cousin 54 - 35       maternal first cousin once removed    Social History   Socioeconomic History   Marital status: Single    Spouse  name: Not on file   Number of children: Not on file   Years of education: Not on file   Highest education level: Not on file  Occupational History   Occupation: student    Comment: studying development worker, community at [employer]   Tobacco Use   Smoking status: Never   Smokeless tobacco: Never  Vaping Use   Vaping status: Never Used  Substance and Sexual Activity   Alcohol use: No   Drug use: Never   Sexual activity: Yes    Birth control/protection: None    Comment: none needed at the moment   Other Topics Concern   Not on file  Social History Narrative   ** Merged History Encounter **       Social Drivers of Health   Tobacco Use: Low Risk (03/03/2024)   Patient History    Smoking Tobacco Use: Never    Smokeless Tobacco Use: Never    Passive Exposure: Not on file  Financial Resource Strain: Not on file  Food Insecurity: Not on file  Transportation Needs: Not on file  Physical Activity: Not on file  Stress: Not on file  Social Connections: Not on file  Intimate Partner Violence: Not on file  Depression (PHQ2-9): Low Risk (04/30/2023)   Depression (PHQ2-9)    PHQ-2 Score: 0  Alcohol Screen: Not on file  Housing: Not on file  Utilities: Not on  file  Health Literacy: Not on file    Current Medications[1]  EXAM:  VITALS per patient if applicable: Ht 5' 5 (1.651 m)   Wt 258 lb (117 kg)   BMI 42.93 kg/m   GENERAL: alert, oriented, appears well and in no acute distress  HEENT: atraumatic, conjunttiva clear, no obvious abnormalities on inspection of external nose and ears  NECK: normal movements of the head and neck  LUNGS: on inspection no signs of respiratory distress, breathing rate appears normal, no obvious gross SOB, gasping or wheezing  CV: no obvious cyanosis  MS: moves all visible extremities without noticeable abnormality  PSYCH/NEURO: pleasant and cooperative, no obvious depression or anxiety, speech and thought processing grossly intact  ASSESSMENT  AND PLAN:  Discussed the following assessment and plan:  Abnormal uterine bleeding (AUB) Symptoms improved at this point.  Adding naproxen  to use as needed for increased bleeding or cramping if this does occur again.  We discussed options that we can consider if this is a recurrent issue including OCP and or IUD.  Checking labs including CBC and thyroid function.     I discussed the assessment and treatment plan with the patient. The patient was provided an opportunity to ask questions and all were answered. The patient agreed with the plan and demonstrated an understanding of the instructions.   The patient was advised to call back or seek an in-person evaluation if the symptoms worsen or if the condition fails to improve as anticipated.    Velma Ku, DO      [1]  Current Outpatient Medications:    albuterol  (VENTOLIN  HFA) 108 (90 Base) MCG/ACT inhaler, INHALE 1-2 PUFFS INTO THE LUNGS EVERY 4 HOURS AS NEEDED FOR WHEEZING OR SHORTNESS OF BREATH., Disp: 18 each, Rfl: 2   amphetamine -dextroamphetamine (ADDERALL XR) 20 MG 24 hr capsule, Take 1 capsule (20 mg total) by mouth every morning., Disp: 30 capsule, Rfl: 0   aspirin -acetaminophen -caffeine  (EXCEDRIN  MIGRAINE) 250-250-65 MG tablet, Take 1-2 tablets by mouth every 6 (six) hours as needed for headache., Disp: 30 tablet, Rfl: 0   cyclobenzaprine  (FLEXERIL ) 10 MG tablet, Take 1 tablet (10 mg total) by mouth 3 (three) times daily as needed for muscle spasms., Disp: 30 tablet, Rfl: 0   escitalopram  (LEXAPRO ) 20 MG tablet, TAKE 1 TABLET BY MOUTH EVERY DAY, Disp: 30 tablet, Rfl: 2   fluticasone  (FLONASE ) 50 MCG/ACT nasal spray, SHAKE LIQUID AND USE 2 SPRAYS IN EACH NOSTRIL DAILY, Disp: 16 g, Rfl: 0   hydrOXYzine  (VISTARIL ) 25 MG capsule, Take 1 capsule (25 mg total) by mouth 3 (three) times daily as needed for anxiety., Disp: 90 capsule, Rfl: 0   PREVIDENT 5000 ENAMEL PROTECT 1.1-5 % GEL, USE AS DIRECTED BRUSH TEETH TWICE A DAY, Disp: ,  Rfl:    XOFLUZA , 80 MG DOSE, 1 x 80 MG TBPK, Take 1 tablet by mouth daily., Disp: , Rfl:    naproxen  (NAPROSYN ) 500 MG tablet, Take 1 tablet (500 mg total) by mouth 2 (two) times daily with a meal., Disp: 30 tablet, Rfl: 0  "

## 2024-03-08 NOTE — Assessment & Plan Note (Signed)
 Symptoms improved at this point.  Adding naproxen  to use as needed for increased bleeding or cramping if this does occur again.  We discussed options that we can consider if this is a recurrent issue including OCP and or IUD.  Checking labs including CBC and thyroid function.

## 2024-03-12 ENCOUNTER — Ambulatory Visit: Payer: Self-pay | Admitting: Family Medicine

## 2024-03-18 ENCOUNTER — Encounter: Payer: Self-pay | Admitting: Family Medicine

## 2024-03-18 MED ORDER — AMPHETAMINE-DEXTROAMPHET ER 20 MG PO CP24: 20.0000 mg | ORAL_CAPSULE | ORAL | 0 refills | Status: AC

## 2024-04-30 ENCOUNTER — Encounter: Payer: 59 | Admitting: Family Medicine
